# Patient Record
Sex: Male | Born: 1950 | Race: Black or African American | Hispanic: No | Marital: Married | Smoking: Former smoker
Health system: Southern US, Community
[De-identification: ages and names within clinical notes are randomized; demographics above are authoritative.]

## PROBLEM LIST (undated history)

## (undated) DIAGNOSIS — K220 Achalasia of cardia: Secondary | ICD-10-CM

## (undated) DIAGNOSIS — I10 Essential (primary) hypertension: Secondary | ICD-10-CM

## (undated) DIAGNOSIS — M109 Gout, unspecified: Secondary | ICD-10-CM

## (undated) DIAGNOSIS — Z974 Presence of external hearing-aid: Secondary | ICD-10-CM

## (undated) DIAGNOSIS — E119 Type 2 diabetes mellitus without complications: Secondary | ICD-10-CM

## (undated) DIAGNOSIS — E7800 Pure hypercholesterolemia, unspecified: Secondary | ICD-10-CM

## (undated) HISTORY — PX: HERNIA REPAIR: SHX51

## (undated) HISTORY — PX: CHOLECYSTECTOMY: SHX55

---

## 2009-01-14 DIAGNOSIS — R17 Unspecified jaundice: Secondary | ICD-10-CM | POA: Insufficient documentation

## 2009-03-06 DIAGNOSIS — M109 Gout, unspecified: Secondary | ICD-10-CM | POA: Insufficient documentation

## 2009-03-06 DIAGNOSIS — E785 Hyperlipidemia, unspecified: Secondary | ICD-10-CM | POA: Insufficient documentation

## 2010-10-07 ENCOUNTER — Emergency Department: Payer: Self-pay | Admitting: *Deleted

## 2010-11-12 ENCOUNTER — Ambulatory Visit: Payer: Self-pay | Admitting: Surgery

## 2010-11-18 ENCOUNTER — Ambulatory Visit: Payer: Self-pay | Admitting: Surgery

## 2010-11-20 LAB — PATHOLOGY REPORT

## 2011-07-15 ENCOUNTER — Emergency Department: Payer: Self-pay | Admitting: Emergency Medicine

## 2011-07-15 LAB — BASIC METABOLIC PANEL
Anion Gap: 5 — ABNORMAL LOW (ref 7–16)
BUN: 17 mg/dL (ref 7–18)
Calcium, Total: 9 mg/dL (ref 8.5–10.1)
Chloride: 106 mmol/L (ref 98–107)
Creatinine: 1.5 mg/dL — ABNORMAL HIGH (ref 0.60–1.30)
EGFR (African American): 58 — ABNORMAL LOW
EGFR (Non-African Amer.): 50 — ABNORMAL LOW
Glucose: 122 mg/dL — ABNORMAL HIGH (ref 65–99)
Potassium: 3.8 mmol/L (ref 3.5–5.1)

## 2011-07-15 LAB — CBC
HGB: 13.5 g/dL (ref 13.0–18.0)
MCH: 30.3 pg (ref 26.0–34.0)
MCV: 95 fL (ref 80–100)
Platelet: 168 10*3/uL (ref 150–440)
RBC: 4.46 10*6/uL (ref 4.40–5.90)
WBC: 14.4 10*3/uL — ABNORMAL HIGH (ref 3.8–10.6)

## 2011-07-16 LAB — LIPASE, BLOOD: Lipase: 1216 U/L — ABNORMAL HIGH (ref 73–393)

## 2012-04-28 ENCOUNTER — Emergency Department: Payer: Self-pay | Admitting: Emergency Medicine

## 2012-04-28 LAB — CBC
HCT: 40.7 % (ref 40.0–52.0)
HGB: 13.5 g/dL (ref 13.0–18.0)
MCHC: 33.3 g/dL (ref 32.0–36.0)
MCV: 93 fL (ref 80–100)
Platelet: 182 10*3/uL (ref 150–440)
RDW: 14.9 % — ABNORMAL HIGH (ref 11.5–14.5)

## 2012-04-28 LAB — COMPREHENSIVE METABOLIC PANEL
Albumin: 3.6 g/dL (ref 3.4–5.0)
Alkaline Phosphatase: 73 U/L (ref 50–136)
Anion Gap: 6 — ABNORMAL LOW (ref 7–16)
Calcium, Total: 9.3 mg/dL (ref 8.5–10.1)
Chloride: 108 mmol/L — ABNORMAL HIGH (ref 98–107)
Creatinine: 1.27 mg/dL (ref 0.60–1.30)
EGFR (Non-African Amer.): >60
Osmolality: 287 (ref 275–301)
Potassium: 4.1 mmol/L (ref 3.5–5.1)

## 2012-10-13 ENCOUNTER — Emergency Department: Payer: Self-pay | Admitting: Emergency Medicine

## 2012-10-13 LAB — COMPREHENSIVE METABOLIC PANEL
Alkaline Phosphatase: 95 U/L (ref 50–136)
BUN: 16 mg/dL (ref 7–18)
Bilirubin,Total: 0.5 mg/dL (ref 0.2–1.0)
Creatinine: 1.25 mg/dL (ref 0.60–1.30)
Osmolality: 281 (ref 275–301)
Potassium: 4.2 mmol/L (ref 3.5–5.1)
SGOT(AST): 17 U/L (ref 15–37)
Sodium: 139 mmol/L (ref 136–145)
Total Protein: 8.7 g/dL — ABNORMAL HIGH (ref 6.4–8.2)

## 2012-10-13 LAB — URINALYSIS, COMPLETE
Bilirubin,UR: NEGATIVE
Glucose,UR: NEGATIVE mg/dL (ref 0–75)
Hyaline Cast: 5
Protein: NEGATIVE
RBC,UR: <1 /HPF (ref 0–5)
Specific Gravity: 1.015 (ref 1.003–1.030)
Squamous Epithelial: <1
WBC UR: 5 /HPF (ref 0–5)

## 2012-10-13 LAB — CBC
HGB: 15.6 g/dL (ref 13.0–18.0)
MCH: 31.2 pg (ref 26.0–34.0)
MCHC: 33.7 g/dL (ref 32.0–36.0)
MCV: 93 fL (ref 80–100)
RBC: 5 10*6/uL (ref 4.40–5.90)
RDW: 13.6 % (ref 11.5–14.5)
WBC: 15.7 10*3/uL — ABNORMAL HIGH (ref 3.8–10.6)

## 2012-10-13 LAB — LIPASE, BLOOD: Lipase: 224 U/L (ref 73–393)

## 2012-10-14 ENCOUNTER — Emergency Department: Payer: Self-pay | Admitting: Emergency Medicine

## 2012-10-17 ENCOUNTER — Inpatient Hospital Stay: Payer: Self-pay | Admitting: Internal Medicine

## 2012-10-17 LAB — URINALYSIS, COMPLETE
Bacteria: NONE SEEN
Bilirubin,UR: NEGATIVE
Glucose,UR: 50 mg/dL (ref 0–75)
RBC,UR: <1 /HPF (ref 0–5)
WBC UR: 1 /HPF (ref 0–5)

## 2012-10-17 LAB — COMPREHENSIVE METABOLIC PANEL
Albumin: 3.7 g/dL (ref 3.4–5.0)
Anion Gap: 5 — ABNORMAL LOW (ref 7–16)
Creatinine: 1.5 mg/dL — ABNORMAL HIGH (ref 0.60–1.30)
Potassium: 3.8 mmol/L (ref 3.5–5.1)
SGOT(AST): 28 U/L (ref 15–37)
Sodium: 139 mmol/L (ref 136–145)

## 2012-10-17 LAB — CBC
HCT: 43.2 % (ref 40.0–52.0)
HGB: 14.7 g/dL (ref 13.0–18.0)
MCH: 31.1 pg (ref 26.0–34.0)
MCV: 91 fL (ref 80–100)
Platelet: 197 10*3/uL (ref 150–440)
RBC: 4.72 10*6/uL (ref 4.40–5.90)
RDW: 13.5 % (ref 11.5–14.5)
WBC: 15.1 10*3/uL — ABNORMAL HIGH (ref 3.8–10.6)

## 2012-10-17 LAB — OCCULT BLOOD X 1 CARD TO LAB, STOOL: Occult Blood, Feces: POSITIVE

## 2012-10-17 LAB — CLOSTRIDIUM DIFFICILE BY PCR

## 2012-10-17 LAB — LIPASE, BLOOD: Lipase: 317 U/L (ref 73–393)

## 2012-10-18 LAB — BASIC METABOLIC PANEL
BUN: 13 mg/dL (ref 7–18)
Calcium, Total: 7.9 mg/dL — ABNORMAL LOW (ref 8.5–10.1)
Chloride: 114 mmol/L — ABNORMAL HIGH (ref 98–107)
Co2: 22 mmol/L (ref 21–32)
Creatinine: 1.37 mg/dL — ABNORMAL HIGH (ref 0.60–1.30)
EGFR (African American): >60
EGFR (Non-African Amer.): 55 — ABNORMAL LOW
Glucose: 105 mg/dL — ABNORMAL HIGH (ref 65–99)
Potassium: 3.7 mmol/L (ref 3.5–5.1)
Sodium: 145 mmol/L (ref 136–145)

## 2012-10-18 LAB — CBC WITH DIFFERENTIAL/PLATELET
Basophil %: 0.4 %
Eosinophil %: 8.1 %
HCT: 36 % — ABNORMAL LOW (ref 40.0–52.0)
HGB: 12.2 g/dL — ABNORMAL LOW (ref 13.0–18.0)
Lymphocyte #: 2.3 10*3/uL (ref 1.0–3.6)
Lymphocyte %: 14.9 %
MCH: 31.1 pg (ref 26.0–34.0)
MCHC: 34 g/dL (ref 32.0–36.0)
MCV: 92 fL (ref 80–100)
Monocyte #: 1.3 x10 3/mm — ABNORMAL HIGH (ref 0.2–1.0)
Monocyte %: 8.5 %
Neutrophil %: 68.1 %
Platelet: 160 10*3/uL (ref 150–440)
RBC: 3.93 10*6/uL — ABNORMAL LOW (ref 4.40–5.90)

## 2012-10-18 LAB — HEMOGLOBIN A1C: Hemoglobin A1C: 7.3 % — ABNORMAL HIGH (ref 4.2–6.3)

## 2012-10-18 LAB — TSH: Thyroid Stimulating Horm: 1.61 u[IU]/mL

## 2012-10-18 LAB — WBCS, STOOL

## 2012-10-19 LAB — BASIC METABOLIC PANEL
Anion Gap: 7 (ref 7–16)
BUN: 9 mg/dL (ref 7–18)
Calcium, Total: 7.9 mg/dL — ABNORMAL LOW (ref 8.5–10.1)
Chloride: 117 mmol/L — ABNORMAL HIGH (ref 98–107)
Creatinine: 1.28 mg/dL (ref 0.60–1.30)
Glucose: 87 mg/dL (ref 65–99)
Osmolality: 287 (ref 275–301)
Potassium: 3.3 mmol/L — ABNORMAL LOW (ref 3.5–5.1)
Sodium: 145 mmol/L (ref 136–145)

## 2012-10-19 LAB — CBC WITH DIFFERENTIAL/PLATELET
Basophil #: 0 10*3/uL (ref 0.0–0.1)
Eosinophil #: 1 10*3/uL — ABNORMAL HIGH (ref 0.0–0.7)
Eosinophil %: 9.2 %
Lymphocyte #: 2.4 10*3/uL (ref 1.0–3.6)
MCH: 31.7 pg (ref 26.0–34.0)
MCHC: 34.3 g/dL (ref 32.0–36.0)
Monocyte %: 8.5 %
Neutrophil %: 59.9 %
RBC: 3.63 10*6/uL — ABNORMAL LOW (ref 4.40–5.90)
RDW: 13.5 % (ref 11.5–14.5)
WBC: 10.7 10*3/uL — ABNORMAL HIGH (ref 3.8–10.6)

## 2012-10-19 LAB — PROTIME-INR: Prothrombin Time: 15.5 secs — ABNORMAL HIGH (ref 11.5–14.7)

## 2012-10-19 LAB — MAGNESIUM: Magnesium: 1.2 mg/dL — ABNORMAL LOW

## 2012-10-20 LAB — CBC WITH DIFFERENTIAL/PLATELET
Basophil #: 0 10*3/uL (ref 0.0–0.1)
Basophil %: 0.4 %
HCT: 33.2 % — ABNORMAL LOW (ref 40.0–52.0)
HGB: 11.6 g/dL — ABNORMAL LOW (ref 13.0–18.0)
Lymphocyte #: 2.2 10*3/uL (ref 1.0–3.6)
MCH: 31.9 pg (ref 26.0–34.0)
MCHC: 34.9 g/dL (ref 32.0–36.0)
Neutrophil #: 5.8 10*3/uL (ref 1.4–6.5)
Neutrophil %: 55.9 %
Platelet: 149 10*3/uL — ABNORMAL LOW (ref 150–440)
WBC: 10.3 10*3/uL (ref 3.8–10.6)

## 2012-10-20 LAB — STOOL CULTURE

## 2012-10-26 ENCOUNTER — Emergency Department: Payer: Self-pay | Admitting: Emergency Medicine

## 2012-10-26 LAB — CBC
HGB: 13.5 g/dL (ref 13.0–18.0)
MCV: 92 fL (ref 80–100)
Platelet: 248 10*3/uL (ref 150–440)
RBC: 4.38 10*6/uL — ABNORMAL LOW (ref 4.40–5.90)

## 2012-10-26 LAB — COMPREHENSIVE METABOLIC PANEL
Albumin: 3.5 g/dL (ref 3.4–5.0)
Anion Gap: 5 — ABNORMAL LOW (ref 7–16)
BUN: 12 mg/dL (ref 7–18)
Bilirubin,Total: 0.6 mg/dL (ref 0.2–1.0)
Calcium, Total: 9.3 mg/dL (ref 8.5–10.1)
Chloride: 106 mmol/L (ref 98–107)
Co2: 27 mmol/L (ref 21–32)
EGFR (Non-African Amer.): 57 — ABNORMAL LOW
Glucose: 96 mg/dL (ref 65–99)
Osmolality: 275 (ref 275–301)
Potassium: 4.1 mmol/L (ref 3.5–5.1)
SGOT(AST): 24 U/L (ref 15–37)
SGPT (ALT): 30 U/L (ref 12–78)

## 2012-10-26 LAB — URINALYSIS, COMPLETE
Bacteria: NONE SEEN
Blood: NEGATIVE
Glucose,UR: NEGATIVE mg/dL (ref 0–75)
Ketone: NEGATIVE
Protein: NEGATIVE
Specific Gravity: 1.013 (ref 1.003–1.030)

## 2012-10-28 ENCOUNTER — Other Ambulatory Visit: Payer: Self-pay | Admitting: Gastroenterology

## 2013-01-20 ENCOUNTER — Ambulatory Visit: Payer: Self-pay | Admitting: Cardiothoracic Surgery

## 2013-02-07 ENCOUNTER — Ambulatory Visit: Payer: Self-pay | Admitting: Cardiothoracic Surgery

## 2013-07-26 IMAGING — CT CT ABD-PELV W/O CM
1 of 2 series · 15 of 32 positions shown, 19 images · non-contrast
Comparison: none

REASON FOR EXAM: (1) ABD PAIN; (2) ABD PAIN
COMMENTS:

[Series 2: 3mm soft tissue · axial · 0.72mm/px · z∈[-1568,-1166]mm · 15 of 148 slices shown, 19 images]
[im 7/148  soft-tissue]
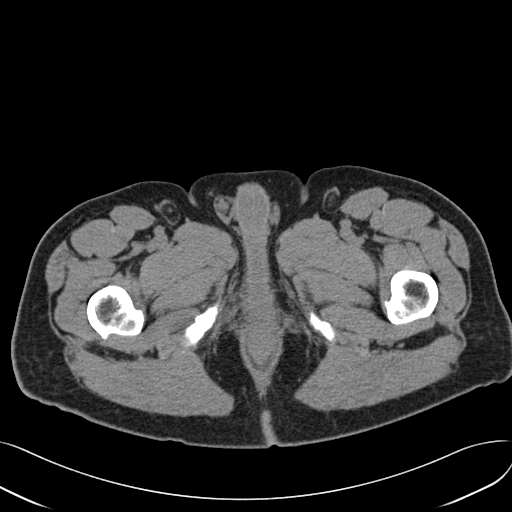
[im 7/148  bone]
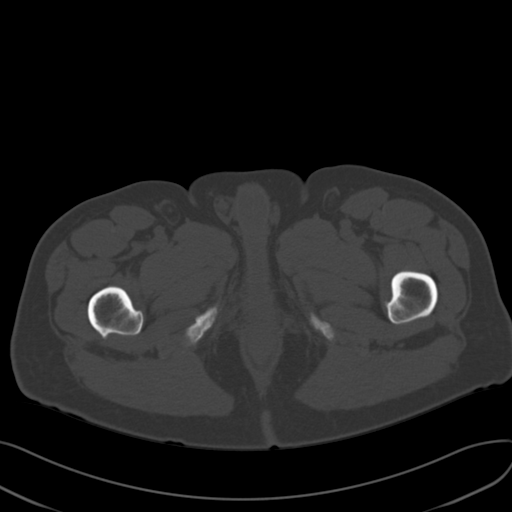
[im 19/148  soft-tissue]
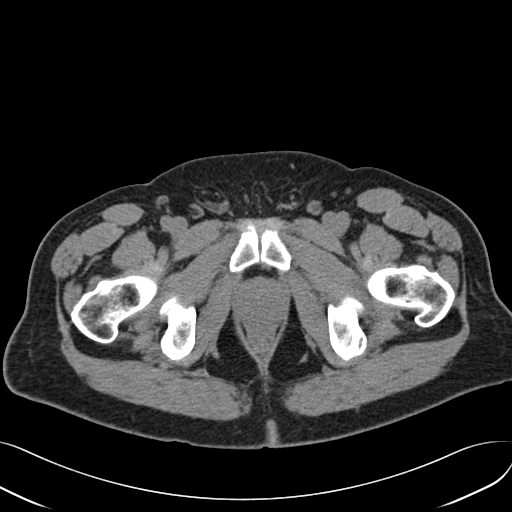
[im 31/148  soft-tissue]
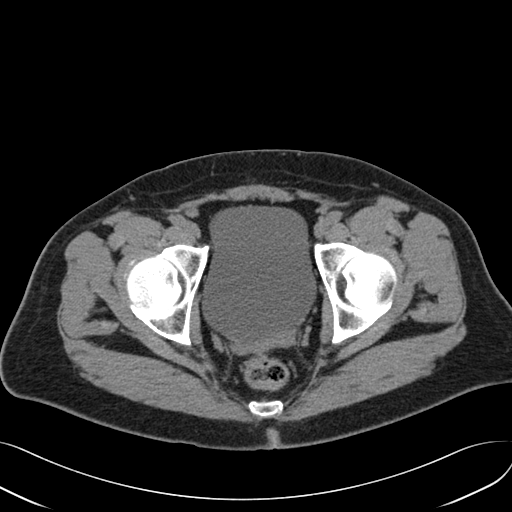
[im 43/148  soft-tissue]
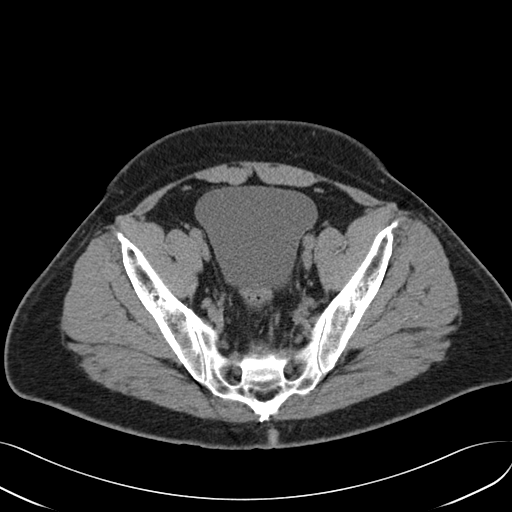
[im 50/148  soft-tissue]
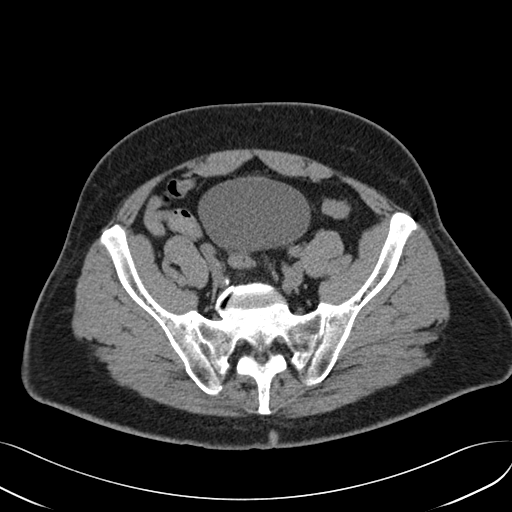
[im 62/148  soft-tissue]
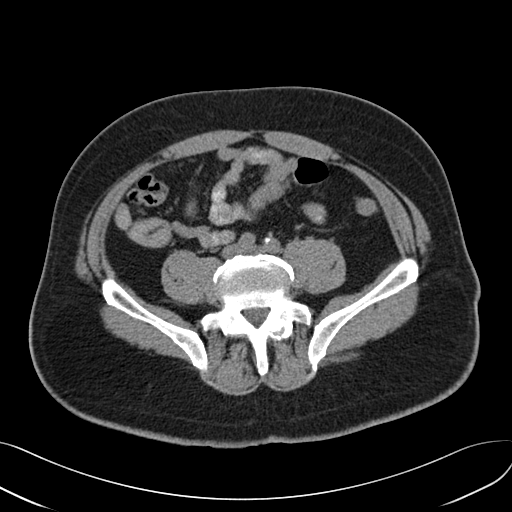
[im 74/148  soft-tissue]
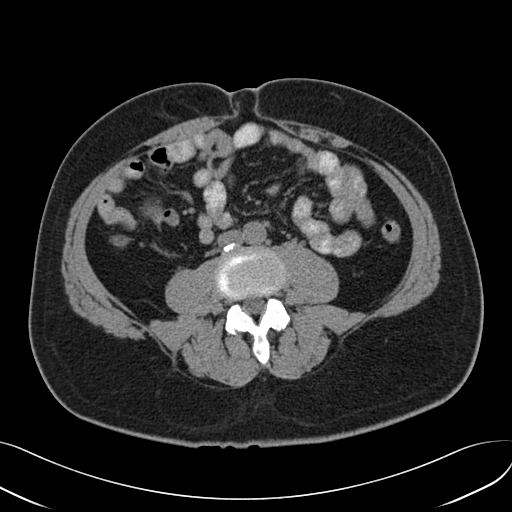
[im 86/148  soft-tissue]
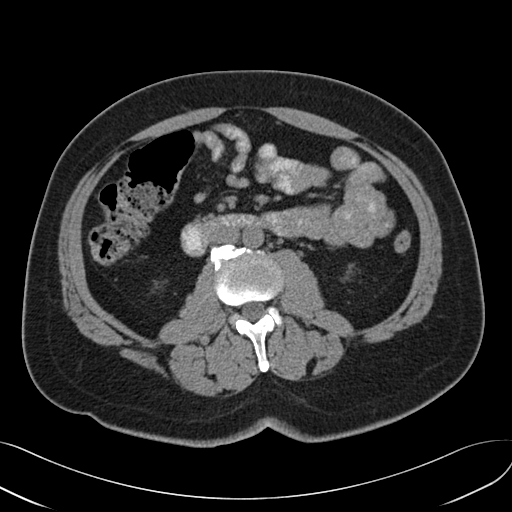
[im 99/148  soft-tissue]
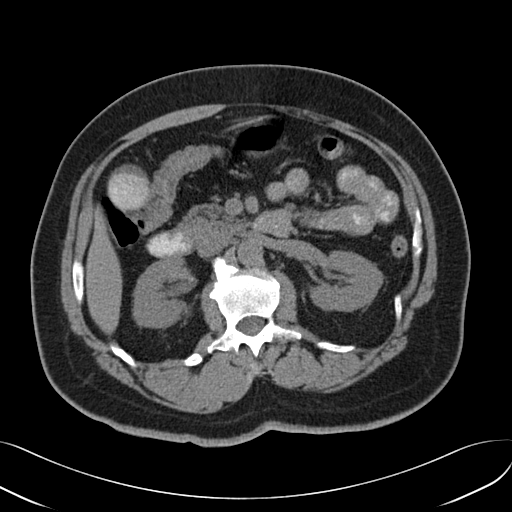
[im 99/148  bone]
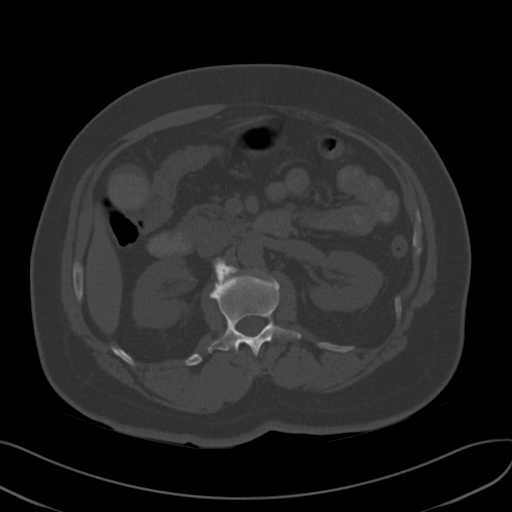
[im 105/148  soft-tissue]
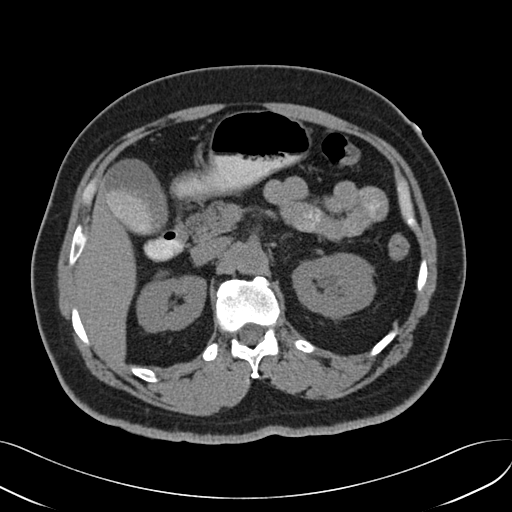
[im 117/148  soft-tissue]
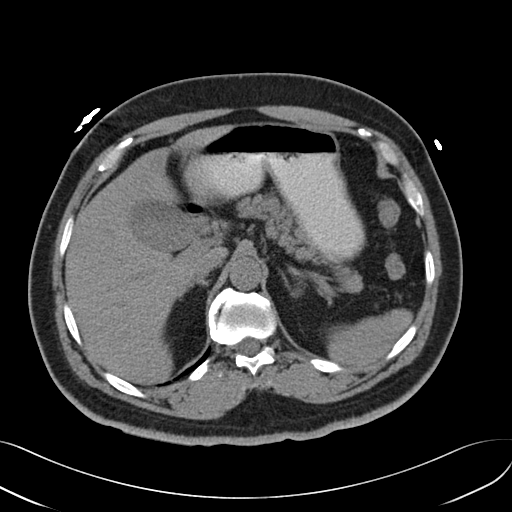
[im 123/148  lung]
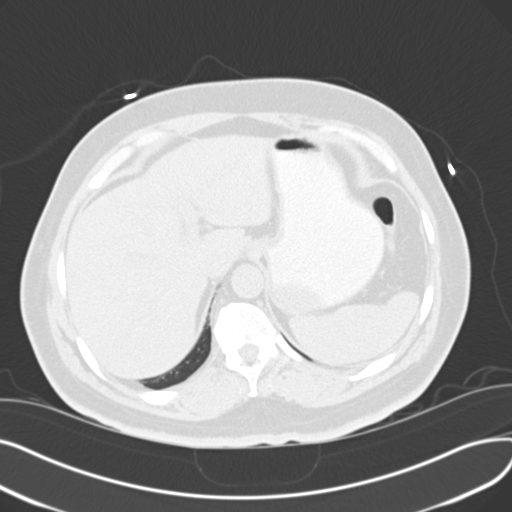
[im 129/148  soft-tissue]
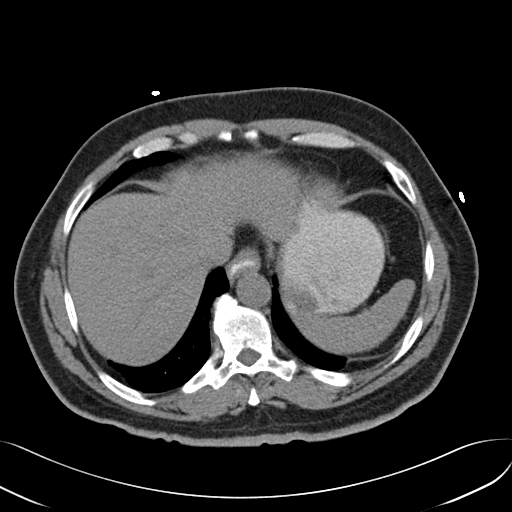
[im 129/148  lung]
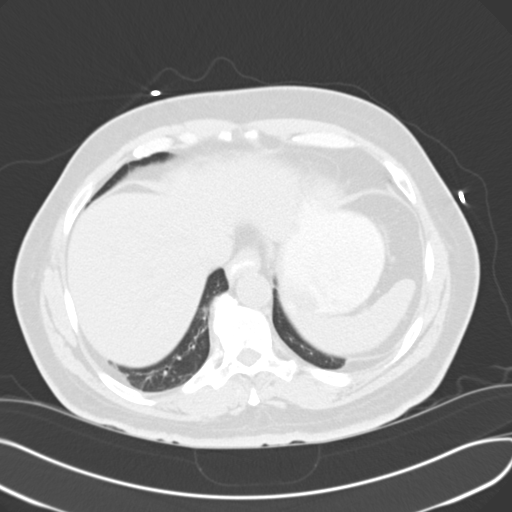
[im 135/148  lung]
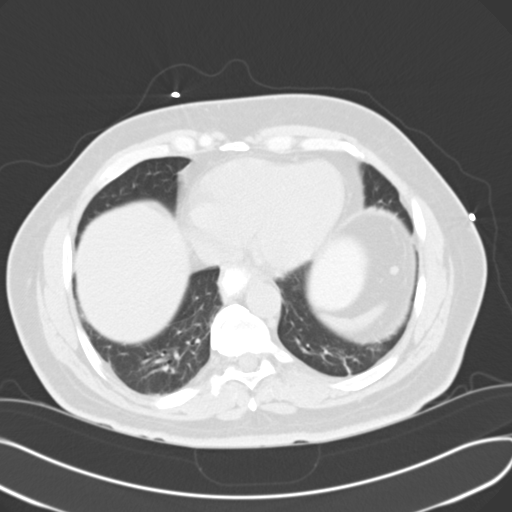
[im 141/148  soft-tissue]
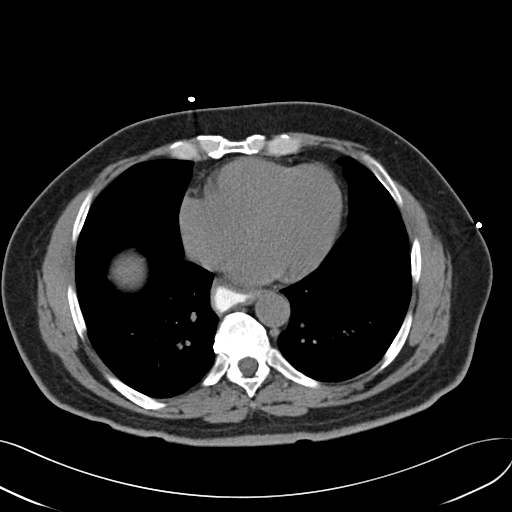
[im 141/148  lung]
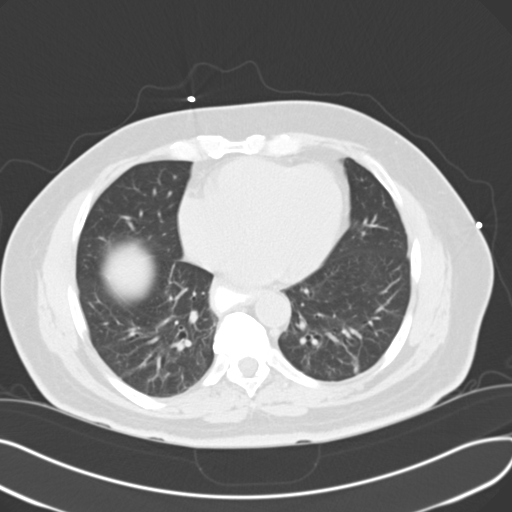

[15 of 32 positions shown; findings below may reference images not displayed]

PROCEDURE:     CT  - CT ABDOMEN AND PELVIS W[DATE]  [DATE]

RESULT:     Emergent noncontrast CT of the abdomen and pelvis is
reconstructed in the axial plane at 3.0 mm slice thickness. The patient has
no previous exam for comparison.

There is dependent atelectasis in both lower lobes. The noncontrast images
of the liver, spleen, pancreas and adrenal glands are unremarkable. There is
density within the gallbladder suggestive of stones or sludge. There is no
evidence of acute cholecystitis by CT. No nephrolithiasis or hydronephrosis
is evident. No abnormal gastric or bowel distention is seen. A
normal-appearing appendix appears present. Urine is present within the
bladder. There is no hydroureter or hydronephrosis. The prostate appears
within normal limits. No adenopathy or acute inflammation is seen. There is
a small fat-filled umbilical hernia. The aorta is normal in caliber.
Degenerative osteophytic spurring is present within the spine.
IMPRESSION: 1. Findings of cholelithiasis without definite CT evidence of acute
cholecystitis.
2. Incidental note, not mentioned above, is made of some oral contrast in
the distal esophagus. Correlate for possible gastroesophageal reflux.
3. Small fat-filled umbilical hernia.

## 2014-06-05 ENCOUNTER — Emergency Department: Payer: Self-pay | Admitting: Emergency Medicine

## 2014-06-05 LAB — CBC WITH DIFFERENTIAL/PLATELET
BASOS ABS: 0.1 10*3/uL (ref 0.0–0.1)
Basophil %: 0.8 %
Eosinophil #: 0.2 10*3/uL (ref 0.0–0.7)
Eosinophil %: 3.1 %
HCT: 39.8 % — ABNORMAL LOW (ref 40.0–52.0)
HGB: 13 g/dL (ref 13.0–18.0)
LYMPHS ABS: 1.5 10*3/uL (ref 1.0–3.6)
Lymphocyte %: 21 %
MCH: 30 pg (ref 26.0–34.0)
MCHC: 32.7 g/dL (ref 32.0–36.0)
MCV: 92 fL (ref 80–100)
MONO ABS: 0.6 x10 3/mm (ref 0.2–1.0)
Monocyte %: 9.1 %
Neutrophil #: 4.6 10*3/uL (ref 1.4–6.5)
Neutrophil %: 66 %
Platelet: 197 10*3/uL (ref 150–440)
RBC: 4.33 10*6/uL — ABNORMAL LOW (ref 4.40–5.90)
RDW: 13.7 % (ref 11.5–14.5)
WBC: 7 10*3/uL (ref 3.8–10.6)

## 2014-06-05 LAB — BASIC METABOLIC PANEL
Anion Gap: 8 (ref 7–16)
BUN: 17 mg/dL
CREATININE: 1.17 mg/dL
Calcium, Total: 9.4 mg/dL
Chloride: 106 mmol/L
Co2: 27 mmol/L
EGFR (African American): >60
Glucose: 147 mg/dL — ABNORMAL HIGH
Potassium: 4 mmol/L
SODIUM: 141 mmol/L

## 2014-06-05 LAB — TROPONIN I
Troponin-I: <0.03 ng/mL
Troponin-I: <0.03 ng/mL

## 2014-06-30 NOTE — Consult Note (Signed)
Chief Complaint:  Subjective/Chief Complaint seen for dysphagia and abnormal ct sigmoid colon. tolerated prep for colonoscopy well.  mild nausea, no bleeding with the prep.  mild lower abdominal discomfort.   VITAL SIGNS/ANCILLARY NOTES: **Vital Signs.:   13-Aug-14 04:09  Vital Signs Type Routine  Temperature Temperature (F) 98  Celsius 36.6  Pulse Pulse 57  Respirations Respirations 20  Systolic BP Systolic BP 103  Diastolic BP (mmHg) Diastolic BP (mmHg) 62  Mean BP 75  Pulse Ox % Pulse Ox % 97  Pulse Ox Activity Level  At rest  Oxygen Delivery Room Air/ 21 %   Brief Assessment:  Cardiac Regular   Respiratory clear BS   Gastrointestinal details normal Soft  Bowel sounds normal  No rebound tenderness  mild lower discomfort to palpation   Lab Results: Microbiology:  10-Aug-14 22:02   Ova and Parasites (O/P) ========== TEST NAME ==========  ========= RESULTS =========  = REFERENCE RANGE =  OVA & PARASITES (O&P)  Ova + Parasite Exam Ova + Parasite Exam             [   Final report         ]                   These results were obtained using wet preparation(s) and trichrome stained smear. This test does not include testing for Cryptosporidium parvum, Cyclospora, or Microsporidia. Result 1                        [   Final Report         ]                   No ova, cysts, or parasites seen.                                                          . One negative specimen does not rule out the possibility of a parasitic infection.               [facility]            No: [id number]           [address], [place], [place] [address]           Mila Homer, MD         [id number]   Result(s) reported on 20 Oct 2012 at 10:49AM.  Routine Chem:  13-Aug-14 04:03   Magnesium, Serum 2.1 (1.8-2.4 THERAPEUTIC RANGE: 4-7 mg/dL TOXIC: > 10 mg/dL  -----------------------)  Potassium, Serum 3.5 (Result(s) reported on 20 Oct 2012 at 04:54AM.)  Routine Sero:   10-Aug-14 22:02   Occult Blood, Feces POSITIVE (Result(s) reported on 17 Oct 2012 at 11:13PM.)  Routine Coag:  12-Aug-14 04:03   INR 1.2 (INR reference interval applies to patients on anticoagulant therapy. A single INR therapeutic range for coumarins is not optimal for all indications; however, the suggested range for most indications is 2.0 - 3.0. Exceptions to the INR Reference Range may include: Prosthetic heart valves, acute myocardial infarction, prevention of myocardial infarction, and combinations of aspirin and anticoagulant. The need for a higher or lower target INR must be assessed individually. Reference: The Pharmacology and Management of the Vitamin K  antagonists: the seventh ACCP Conference on Antithrombotic and Thrombolytic Therapy. Chest.2004 Sept:126 (3suppl): [id number]. A HCT value >55% may artifactually increase the PT.  In one study,  the increase was an average of 25%. Reference:  "Effect on Routine and Special Coagulation Testing Values of Citrate Anticoagulant Adjustment in Patients with High HCT Values." American Journal of Clinical Pathology 2006;126:400-405.)  Routine Hem:  13-Aug-14 04:03   WBC (CBC) 10.3  RBC (CBC)  3.63  Hemoglobin (CBC)  11.6  Hematocrit (CBC)  33.2  Platelet Count (CBC)  149  MCV 91  MCH 31.9  MCHC 34.9  RDW 13.4  Neutrophil % 55.9  Lymphocyte % 21.7  Monocyte % 7.2  Eosinophil % 14.8  Basophil % 0.4  Neutrophil # 5.8  Lymphocyte # 2.2  Monocyte # 0.7  Eosinophil #  1.5  Basophil # 0.0 (Result(s) reported on 20 Oct 2012 at 04:47AM.)   Assessment/Plan:  Assessment/Plan:  Assessment 1) dysphagia-achalasia per egd and history.  will arrange for outpatient manometry and ba sw.  Possible referral as outpatient for evaluation for heller myotomy.  2) abnormal ct with possible apple core lesion in the distal sigmoid.  Colonoscopy today.   Plan 1) I have discussed wht risks benefits and complications of egd to include no t  limited to bleeding infection perforation and sedation and he wishes to proceed.   Electronic Signatures: Barnetta ChapelSkulskie, Martin (MD)  (Signed 13-Aug-14 12:55)  Authored: Chief Complaint, VITAL SIGNS/ANCILLARY NOTES, Brief Assessment, Lab Results, Assessment/Plan   Last Updated: 13-Aug-14 12:55 by Barnetta ChapelSkulskie, Martin (MD)

## 2014-06-30 NOTE — H&P (Signed)
PATIENT NAME:  Andrew Cantrell, Andrew G MR#:  [id number] DATE OF BIRTH:  [birth year]  DATE OF ADMISSION:  10/18/2012  PRIMARY CARE PHYSICIAN: Dr. Darreld McleanLinda Miles.   REFERRING PHYSICIAN: Dr. Dorothea GlassmanPaul Malinda.   CHIEF COMPLAINT: Nausea, vomiting and diarrhea.   HISTORY OF PRESENT ILLNESS: Andrew Cantrell is a 64 year old African American male with past medical history of diabetes mellitus and hypertension, presented to the Emergency Department with complaints of nausea, vomiting and diarrhea of 1 week's duration. Has been having multiple episodes of vomiting of whatever the patient eats. Also has been having diarrhea, multiple episodes of greenish-brownish stools with some fecal material, having 6 to 7 times a day. This is associated with abdominal pain in the periumbilical area. Experiences some subjective fevers. Denies having any sick contacts. Denies eating any leftover food or started on recent medication. He denies having any recent travel. Workup in the Emergency Department with CT abdomen and pelvis showed there was concern about narrowing of the rectosigmoid area. Otherwise, no other obvious abnormalities were noted. Lab workup revealed mild elevation of the BUN and creatinine, as well as elevated white blood cell count. The patient received IV fluids in the Emergency Department as well as antinausea medication. The patient came to the Emergency Department in the last 2 to 3 days. Was treated conservatively without any improvement.   PAST MEDICAL HISTORY:  1. Hypertension.  2. Diabetes mellitus, on oral medication.  3. Hyperlipidemia.  4. Gout.  5. History of gallstones, status post cholecystectomy.  6. Previous history of pancreatitis.   ALLERGIES: MORPHINE AND TYLENOL.   HOME MEDICATIONS:  1. Zofran 4 mg every 4 hours as needed.  2. Oxycodone 5 mg every 6 hours as needed.  3. Metformin 500 mg 2 times a day.  4. Lovastatin 40 mg at bedtime.  5. Lisinopril 5 mg once a day.  6. Gabapentin 300 mg 3  times a day.  7. Celebrex 200 mg as needed.   SOCIAL HISTORY: Denies smoking, drinking alcohol or using illicit drugs. Married, lives with his wife. Works as a Administratorlandscaper.   FAMILY HISTORY: History of diabetes mellitus and hypertension.   REVIEW OF SYSTEMS:  CONSTITUTIONAL: Generalized weakness.  EYES: No change in vision.  EARS AND THROAT: No change in hearing. No sore throat or tinnitus.  RESPIRATORY: No cough, shortness of breath.  CARDIOVASCULAR: No chest pain, palpitations.  GASTROINTESTINAL: Nausea, vomiting and diarrhea.  GENITOURINARY: No dysuria or hematuria.  ENDOCRINE: No polyuria or polydipsia.  HEMATOLOGIC: No easy bruising or bleeding.  NEUROLOGIC: No weakness or numbness in any part of the body.   PHYSICAL EXAMINATION:  GENERAL: This is a well-built, well-nourished, age-appropriate male lying down in the bed, not in distress.  VITAL SIGNS: Temperature 99.4, pulse 93, blood pressure 121/64, respiratory rate of 16, oxygen saturation is 98% on room air.  HEENT: Head normocephalic, atraumatic. There is no sclerae icterus. Conjunctivae normal. Pupils equal and react to light. Extraocular movements are intact. Mucous membranes: Mild dryness. No pharyngeal erythema.  NECK: Supple. No lymphadenopathy. No JVD. No carotid bruit. No thyromegaly.  CHEST: Has no focal tenderness.  LUNGS: Bilaterally clear to auscultation.  HEART: S1, S2, regular, tachycardia. No pedal edema. Pulses 2+ in the dorsalis pedis and posterior tibialis.  ABDOMEN: Mild distention of the abdomen. Bowel sounds present. Soft. Mild tenderness in the periumbilical area. No guarding or rebound tenderness. No hepatosplenomegaly.  NEUROLOGIC: The patient is alert, oriented to place, person and time. Cranial nerves II through XII intact. No motor  and sensory deficits.  SKIN: No rash or lesions.  MUSCULOSKELETAL: Good range of motion in all of the extremities.   LABS: UA negative for nitrites and leukocyte esterase.  CT abdomen and pelvis as mentioned above showed:  1. Luminal narrowing at the rectosigmoid area with concern about possible carcinoma; however, there is a possibility that this is spasm. Fibrotic changes in the area could also give a similar appearance.  2. Hiatal hernia.   3. Right inguinal hernia.   4. Hepatic steatosis.   ASSESSMENT AND PLAN: Andrew Cantrell is a 64 year old male who comes to the Emergency Department with nausea, vomiting, abdominal pain, diarrhea.  1. Gastroenteritis: Keep the patient clear liquids. Continue with antinausea medications, intravenous fluids Clostridium difficile toxin is negative. Could give Imodium if needed.  2. Leukocytosis: Could be from the dehydration.  3. Acute renal insufficiency secondary to dehydration: Continue with intravenous fluids.  4. Diabetes mellitus: Will hold the metformin for now. Keep the patient on sliding scale insulin.  5. Hypertension: Currently well controlled. Continue with home medications.  6. Keep the patient on deep vein thrombosis prophylaxis with Lovenox.   TIME SPENT: 45 minutes.   ____________________________ Susa Griffins, MD pv:gb D: 10/18/2012 00:14:55 ET T: 10/18/2012 02:49:01 ET JOB#: [id number]  cc: Susa Griffins, MD, <Dictator> Leanna Sato, MD Susa Griffins MD ELECTRONICALLY SIGNED 11/10/2012 21:58

## 2014-06-30 NOTE — Consult Note (Signed)
Brief Consult Note: Diagnosis: NVD.   Patient was seen by consultant.   Consult note dictated.   Comments: Appreciate consult for very pleasant 64 y/o [place] man for evaluation of NVD. States symptoms started a week ago monday: developed some lower abdominal cramping followed by diarrhea, then indigestion,  nausea and vomiting. Unable to identify a trigger- no sick contacts/antibiotics. Feels weak, bloated, and that his stomach bubbles sometimes. States that he has had 7 episodes of vomting- either light colored, or with food stuffs. No hematemesis. Up to 8 stools/d, described as green and watery.  Associated with nocturnal wakenings and urgency. Has not wanted to eat or leave the house due to this. Beano seemed to help some, but Tums exacerbated sx. States he is feeling some better today. C-diff test negative, stool culter negative so far, no wbc/rbc in stool, but heme positive. 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 Does report a history of intermittent dysphagia that has worsened with his current condition- worse with pills and solids, soft foods ok.  Do note applecore type lesion to the rectosigmoid colon  and patulous area in the distal esophagus,  Labs stable. Currently on IV PPI and clear liquid diet. Impression and plan: Abdominal pain, GERD, dysphagia, diarrhea, abnormal CT findings, heme positive stool. Patient feeling somewhat better. Agree with PPI. Do recommend luminal evaluation- will start with EGD and then proceed to colonoscopy. Have explained this to patient and wife and they are agreeable..  Electronic Signatures: Vevelyn PatLondon, Makylee Sanborn H (NP)  (Signed 11-Aug-14 18:07)  Authored: Brief Consult Note   Last Updated: 11-Aug-14 18:07 by Keturah BarreLondon, Vamsi Apfel H (NP)

## 2014-06-30 NOTE — Consult Note (Signed)
Chief Complaint:  Subjective/Chief Complaint mild epigastric pain, mild nausea no emesis, one watery bm this am.  not bloody.   VITAL SIGNS/ANCILLARY NOTES: **Vital Signs.:   12-Aug-14 04:07  Vital Signs Type Routine  Temperature Temperature (F) 98.2  Celsius 36.7  Pulse Pulse 59  Respirations Respirations 20  Systolic BP Systolic BP 161  Diastolic BP (mmHg) Diastolic BP (mmHg) 65  Mean BP 79  Pulse Ox % Pulse Ox % 94  Pulse Ox Activity Level  At rest  Oxygen Delivery Room Air/ 21 %   Brief Assessment:  Cardiac Regular   Respiratory clear BS   Gastrointestinal details normal Soft  Nondistended  Bowel sounds normal  No rebound tenderness  tender left right epigastrum   Lab Results: Routine Chem:  12-Aug-14 04:03   Magnesium, Serum  1.2 (1.8-2.4 THERAPEUTIC RANGE: 4-7 mg/dL TOXIC: > 10 mg/dL  -----------------------)  Glucose, Serum 87  BUN 9  Creatinine (comp) 1.28  Sodium, Serum 145  Potassium, Serum  3.3  Chloride, Serum  117  CO2, Serum 21  Calcium (Total), Serum  7.9  Anion Gap 7  Osmolality (calc) 287  eGFR (African American) >60  eGFR (Non-African American) >60 (eGFR values <76m/min/1.73 m2 may be an indication of chronic kidney disease (CKD). Calculated eGFR is useful in patients with stable renal function. The eGFR calculation will not be reliable in acutely ill patients when serum creatinine is changing rapidly. It is not useful in  patients on dialysis. The eGFR calculation may not be applicable to patients at the low and high extremes of body sizes, pregnant women, and vegetarians.)  Routine Sero:  10-Aug-14 22:02   Occult Blood, Feces POSITIVE (Result(s) reported on 17 Oct 2012 at 11:13PM.)  Routine Coag:  12-Aug-14 04:03   Prothrombin  15.5  INR 1.2 (INR reference interval applies to patients on anticoagulant therapy. A single INR therapeutic range for coumarins is not optimal for all indications; however, the suggested range for most  indications is 2.0 - 3.0. Exceptions to the INR Reference Range may include: Prosthetic heart valves, acute myocardial infarction, prevention of myocardial infarction, and combinations of aspirin and anticoagulant. The need for a higher or lower target INR must be assessed individually. Reference: The Pharmacology and Management of the Vitamin K  antagonists: the seventh ACCP Conference on Antithrombotic and Thrombolytic Therapy. CWRUEA.5409Sept:126 (3suppl): [id number] A HCT value >55% may artifactually increase the PT.  In one study,  the increase was an average of 25%. Reference:  "Effect on Routine and Special Coagulation Testing Values of Citrate Anticoagulant Adjustment in Patients with High HCT Values." American Journal of Clinical Pathology 2006;126:400-405.)  Routine Hem:  12-Aug-14 04:03   WBC (CBC)  10.7  RBC (CBC)  3.63  Hemoglobin (CBC)  11.5  Hematocrit (CBC)  33.6  Platelet Count (CBC)  149  MCV 93  MCH 31.7  MCHC 34.3  RDW 13.5  Neutrophil % 59.9  Lymphocyte % 22.1  Monocyte % 8.5  Eosinophil % 9.2  Basophil % 0.3  Neutrophil # 6.4  Lymphocyte # 2.4  Monocyte # 0.9  Eosinophil #  1.0  Basophil # 0.0 (Result(s) reported on 19 Oct 2012 at 05:08AM.)   Assessment/Plan:  Assessment/Plan:  Assessment 1) epigastric pain and dysphagia, reported black stools.   2) abnormal ct of recto sigmiod junction.   Plan 1) egd today, I have discussed the risks benefits and complications of egd to include and  not limited to bleeding infection perforation and sedation and he  wishes to proceed.   2) colonoscopy tomorrow.   Electronic Signatures: Loistine Simas (MD)  (Signed 12-Aug-14 14:11)  Authored: Chief Complaint, VITAL SIGNS/ANCILLARY NOTES, Brief Assessment, Lab Results, Assessment/Plan   Last Updated: 12-Aug-14 14:11 by Loistine Simas (MD)

## 2014-06-30 NOTE — Discharge Summary (Signed)
PATIENT NAME:  Verita SchneidersMCMILLAN, Benen G MR#:  [id number] DATE OF BIRTH:  03/12/1950  DATE OF ADMISSION:  10/18/2012 DATE OF DISCHARGE:  10/21/2012  ADMITTING DIAGNOSIS: Diarrhea.   DISCHARGE DIAGNOSES: 1.  Diarrhea of unclear etiology at this time. Cultures for bacterial pathogens are negative.  2.  Guaiac-positive stool.  3.  Status post EGD on 10/19/2012 and colonoscopy on 10/20/2012 by Dr. Marva PandaSkulskie.  4.  Dehydration.  5.  Acute renal failure results on intravenous fluids.  6.  Diabetes mellitus with hemoglobin A1c 7.3.  7.  Respiration pneumonia after colonoscopy.  8.  History of hypertension.  9.  Diabetes.  10.  Hyperlipidemia  11.  Gout.   DISCHARGE CONDITION: Stable.   DISCHARGE MEDICATIONS: The patient is to continue lovastatin 40 mg p.o. daily, lisinopril 5 mg p.o. daily, metformin 500 mg p.o. twice daily, Celebrex 200 mg p.o. daily as needed, allopurinol 300 mg p.o. daily, Gabapentin 300 mg p.o. 3 times daily, Zofran ODT 4 mg every 4 hours as needed, oxycodone 5 mg every 6 hours as needed, hyoscyamine 0.375 mg twice daily, amoxicillin clavulanate 875 mg twice daily for 7 days, pantoprazole 40 mg p.o. daily.   HOME OXYGEN:  No.   DIET: 2 grams salt, low fat, low cholesterol, carbohydrate-controlled diet, mechanical soft.   ACTIVITY LIMITATIONS: As tolerated.   FOLLOW-UP APPOINTMENTS: With Dr. Marva PandaSkulskie in 3 to 4 days after discharge, Dr. Darreld McleanLinda Miles in 2 days after discharge.    CONSULTANTS: Dr. Marva PandaSkulskie. Ms. Vevelyn PatChristiane London, she is a Publishing rights managernurse practitioner of Dr. Reyes IvanSkulskie's, Care Management.   RADIOLOGIC STUDIES:  CT scan of abdomen and pelvis with contrast 10/14/2012 showed possible area of luminal narrowing in the rectosigmoid region on the area of image 112. Underlying carcinoma is not excluded. Possibility of spasm in this region is certainly a consideration.  Fibrotic changes in the area could also give similar appearance,  according to radiologist. Sigmoidoscopic  correlation was recommended. Findings suggesting hiatal hernia, possibility of patulous distal esophagus seen with achalasia is not excluded. Right inguinal hernia, hepatic steatosis status post cholecystectomy.  Chest x-ray, PA and lateral, 10/20/2012 showed left upper lobe and left lower lobe pneumonia. Recommend follow-up radiography to document complete resolution following adequate medical therapy. If there is no complete resolution , then recommend further evaluation with CT of the chest to exclude underlying pathology, according to radiologist.  The patient's lab data done on arrival to the hospital 10/17/2012 showed elevation of creatinine to 1.50, glucose 172, otherwise BMP was unremarkable. The patient's liver enzymes were unremarkable except a total protein was slightly elevated at 8.3. TSH was normal at 1.61.  White blood cell count was high at 15.1, hemoglobin was 14.7, platelet count 197.    HISTORY OF PRESENT ILLNESS: The patient is a 64 year old African American male with a history of diabetes mellitus who presented to the hospital with complaints of 5 days to a week duration of nausea, vomiting as well as diarrhea. Please refer to Dr. Clarita LeberVasireddy's admission on 10/18/2012.   PHYSICAL EXAMINATION:  VITAL SIGNS: On arrival to the hospital, temperature was 99.4, pulse was 93, respiration rate was 16, blood pressure 121/64.  O2  sats were 98% on room air.  Physical exam was unremarkable.   HOSPITAL COURSE:  The patient was admitted to the hospital for further evaluation. Stool cultures were taken and were negative for Salmonella, Shigella or pathogenic E. coli or Campylobacter antigen.  It was also for C. diff toxin. The patient was treated with IV  fluids but not antibiotics, and his condition (Dictation Anomaly) somewhat  improved.  A guaiac of stool was taken and was positive for blood.  At that point, a consultation with a gastroenterologist was obtained, especially in view of recent CT scan  of his abdomen and pelvis revealing abnormalities in the rectosigmoid region. Dr. Marva Panda, as well as nurse practitioner Ms. [employer], saw the patient in consultation and felt that the patient has  abdominal pain, gastroesophageal reflux disease, dysphagia, likely achalasia and guaiac positive stool and recommended comprehensive luminal evaluation, including EGD as well as colonoscopy.  The patient proceeded to upper GI endoscopy on 10/19/2012 .  It was remarkable for achalasia, normal stomach as well as normal examined duodenum. Dr. Marva Panda recommended PPI 40 mg daily and also consider Levbid twice daily until esophageal manometry is done as outpatient.  If esophageal manometry is concerning for significant spasms, pressure, then Dr. 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  At this time, the patient was self-limiting a diet due to dysphagia, and he was not losing weight. Since the patient had abnormal colon appearance in the rectosigmoid area on the CT scan, colonoscopy was also performed on 10/20/2012 by Dr. Marva Panda.  Diverticulosis of the sigmoid colon was noted. Examination otherwise was normal. Distal rectum and anal verge were normal on the  retroflexion view. Biopsies were taken with cold forceps, and the patient was discharged from endoscopy suite.  Unfortunately, during colonoscopy the patient aspirated contents from his esophagus and developed aspiration pneumonitis which was revealed by chest x-ray on 10/20/2012. The patient had chills as well as cough but no significant sputum production. His white blood cell count went up, but he was not febrile. Due to significant inflammation seen on chest x-ray, we decided to start patient on antibiotic therapy. The patient was evaluated for oxygen need.  His O2 sats remained stable on exertion as well as at rest.  They were between 93% to 95% on room air. It was felt that the patient is stable to be discharged home; however, we  recommended him to be seen by his primary care physician as well as gastroenterologist in the next few days after discharge.  In essence, the patient  presented with diarrhea. Also, stool cultures came back negative. The patient had guaiac-positive stool. He underwent EGD as well as colonoscopy. EGD was concerning for achalasia, and manometric testing was recommended to further direct the patient for possible surgery, if needed. During colonoscopy, he aspirated and developed aspiration pneumonitis.  He is to be treated for aspiration pneumonitis and follow up with gastroenterologist in the next  few days after discharge as well as his primary care physician.   In regards to chronic medical problems such as hypertension, diabetes, hyperlipidemia and gout, the patient is to continue his outpatient management. He still has some diarrhea, however, this diarrhea seemed to be subsiding and is not as watery as it has been. Of note, the patient was having some renal insufficiency, acute renal failure on arrival to the Emergency Room on 10/17/2012. At that time, the patient's creatinine was reported as 1.50.  With IV fluid administration, the patient's kidney function improved and on 10/19/2012 was normal at 1.28. It is recommended to follow the patient's kidney function as outpatient and make decisions about rehydration as outpatient, if needed.   DISCHARGE VITAL SIGNS:  On day of discharge, temperature was 98.1, pulse was 70, respirations were 18 up to 20, blood pressure 114/60, O2 sats were 93%  to 95% on room air at rest as well as on exertion. Of note, the patient was able to eat and drink. He eats at least 50% of offered meal.  His diet is being advanced to mechanical soft diet. If he does well with current diet, he will likely be discharged home today.  TIME SPENT: 40 minutes.    ____________________________ Katharina Caper, MD rv:cb D: 10/21/2012 16:51:47 ET T: 10/21/2012 22:41:43 ET JOB#: [id number]  cc: Katharina Caper, MD, <Dictator> Christena Deem, MD Leanna Sato, MD Katharina Caper MD ELECTRONICALLY SIGNED 10/27/2012 12:42

## 2014-06-30 NOTE — Consult Note (Signed)
PATIENT NAME:  Andrew Cantrell, Andrew Cantrell MR#:  [id number] DATE OF BIRTH:  February 15, 1951  DATE OF CONSULTATION:  10/18/2012  REFERRING PHYSICIAN:   CONSULTING PHYSICIAN:  Keturah Barre, NP  We appreciate this consult ordered by Dr. Winona Legato for a very pleasant 64 year old African American man for evaluation of nausea, vomiting and diarrhea. States his symptoms started a week ago Monday, developed some lower abdominal cramping followed by diarrhea, then indigestion, nausea and vomiting. Unable to identify trigger. No sick contacts or antibiotics. Feels weak, bloated and that his stomach bubbles sometimes. States that he has had up to 7 episodes of vomiting this week, either light-colored or with foodstuffs. No hematemesis. Up to 8 stools a day described as watery and green associated with nocturnal awakenings and urgency. He has not wanted to eat or leave the house due to this. Beano seems to help some but Tums exacerbates the symptoms. He states he is feeling some better today. Has not been taking PPI at home. Has had negative C. difficile test, preliminary report of negative stool culture, no white blood cells or red blood cells in the stool but has had heme-positive result for occult blood. He does report a history of dysphasia and achalasia attributed to a work-related injury 20 years ago. States this was treated with a tube and stretching but unable to elaborate on further detail. Does report a history of intermittent dysphagia that is worse with his current condition, worse with pills, solid and soft foods okay. He also reports some heartburn and indigestion. Does not take many NSAIDs at home. Do note apple core-type lesion to the rectosigmoid colon and a patulous area in the distal esophagus consistent with achalasia. His labs are stable. He is currently being maintained on IV PPI and a clear liquid diet and says he feels somewhat better since he has been hospitalized.   PAST MEDICAL HISTORY:   Achalasia, hypertension, diabetes, hyperlipidemia, gout, gallstones, cholecystectomy, pancreatitis not related to alcohol.   ALLERGIES:  MORPHINE, TYLENOL, ZOFRAN.   HOME MEDICATIONS:  Zofran 4 mg q.4 hours p.r.n., oxycodone 5 mg p.o. every 6 hours p.r.n. This was given to him from his ED visit Wednesday or Thursday. Also takes regularly metformin 500 mg b.i.d., lovastatin 40 mg p.o. at bedtime, lisinopril 5 mg p.o. daily, gabapentin 300 mg t.i.d., Celebrex 200 mg p.r.n., allopurinol dose unknown daily.   SOCIAL HISTORY: Denies alcohol, tobacco or illicits. Married lives with wife. Works as a Administrator.   FAMILY HISTORY: Negative for colorectal cancer, colon polyps, liver disease or ulcers. Family history significant for diabetes, hypertension, cardiac disease.   REVIEW OF SYSTEMS: Ten systems reviewed. Significant for generalized weakness, otherwise unremarkable other than what is noted above.   MOST RECENT LABS: Glucose 105, BUN 13, creatinine 1.37, sodium 145, GFR greater than 60, calcium 7.9. A1c 7.3. Lipase 317, total protein 8.3, albumin 3.7, total bilirubin 0.5, ALT 95, AST 28, ALT 30. TSH 1.61. WBC 15.3, hemoglobin 12.2, hematocrit 36, platelets 160, red cells normocytic. Negative C. diff.,  no WBC or RBC in stools, culture negative so far, heme-positive result. CT contrast demonstrating fatty liver, apple core lesion to the rectosigmoid colon 2.1 cm in size, hiatal hernia with patulous distal esophagus consistent with achalasia and a right inguinal hernia.   MOST RECENT VITAL SIGNS: Temperature 98.4, pulse 68, respiratory rate 20, BP 106/64, SaO2  98%.   IMPRESSION AND PLAN: Abdominal pain, gastroesophageal reflux disease, dysphagia, diarrhea, abnormal CT findings, heme-positive stool. The patient feeling somewhat better  but agree with proton pump inhibitor. Do recommend luminal evaluation consisting of esophagogastroduodenoscopy and colonoscopy. Due to the nature of his complaints, we will  begin with an esophagogastroduodenoscopy tomorrow and follow this with a colonoscopy the next day. Risks and benefits of the procedures and indications have been explained. He is agreeable.   These services were provided by Andrew Pathristiane Graig Hessling, MSN, [facility], in collaboration with Andrew Cantrell, M.D., with whom I have discussed this patient in full. Thank you very much for this consult.  ____________________________ Keturah Barrehristiane H. Danniel Tones, NP chl:cs D: 10/18/2012 18:05:00 ET T: 10/18/2012 18:39:19 ET JOB#: [id number]  cc: Keturah Barrehristiane H. Veanna Dower, NP, <Dictator> Eustaquio MaizeHRISTIANE H Dru Primeau FNP ELECTRONICALLY SIGNED 11/05/2012 8:48

## 2014-06-30 NOTE — Consult Note (Signed)
Chief Complaint:  Subjective/Chief Complaint seen for dysphagia and diarrhea.  patient with achalasia.   VITAL SIGNS/ANCILLARY NOTES: **Vital Signs.:   14-Aug-14 04:09  Vital Signs Type Routine  Temperature Temperature (F) 98.1  Celsius 36.7  Temperature Source oral  Pulse Pulse 92  Respirations Respirations 20  Systolic BP Systolic BP 108  Diastolic BP (mmHg) Diastolic BP (mmHg) 63  Mean BP 78  Pulse Ox % Pulse Ox % 92  Pulse Ox Activity Level  At rest  Oxygen Delivery Room Air/ 21 %    12:15  Pulse Ox % Pulse Ox % 95  Pulse Ox Activity Level  With exertion; amb in hall   Brief Assessment:  Cardiac Regular   Respiratory clear BS   Gastrointestinal details normal Soft  Nondistended  No masses palpable  Bowel sounds normal  No gaurding  mild diffuse discomfort   Lab Results:  Routine Hem:  14-Aug-14 04:06   WBC (CBC)  16.4 (Result(s) reported on 21 Oct 2012 at 04:59AM.)   Assessment/Plan:  Invasive Device Daily Assessment of Necessity:  Does the patient currently have any of the following indwelling devices? 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  Patient has been self limiting diet due to dysphagia,  but has not been losing weight.  2) abnormal CT- no lesion on colonoscopy.  mucosal biopsies taken, pending.   Plan 1) further evaluation as above.  will need o/p GI fu within a week to arrange for the further testing.  diet as tolerated. continue ppi. have patient take [employer] colon heqalth probiotic as o/p as directed. [facility].   Electronic Signatures: Barnetta ChapelSkulskie, Partick Musselman (MD)  (Signed 14-Aug-14 13:52)  Authored: Chief Complaint, VITAL SIGNS/ANCILLARY NOTES, Brief Assessment, Lab Results, Assessment/Plan   Last Updated: 14-Aug-14 13:52 by Barnetta ChapelSkulskie, Jaslynne Dahan (MD)

## 2014-06-30 NOTE — Consult Note (Signed)
Chief Complaint:  Subjective/Chief Complaint Patient seen adn examined, please see full GI consult and brief consult note.  Patient presenting with diarrheal illness over the past 2 weeks and lower abdominal pain intermittantly for about 2 months.  Abnormal CT noted, with possible napkin ring lesion in the rectosigmoid.  Patient also having alot of problems with dysphagia in the setting of h/o achalasia with possible rigiflex dilation at [facility] many years ago.  Currently having some dysphagia sx, with alteration of diet and occasional regurgitation of foods.  States he has been seeing black stools, but denies bright red bleeding.  Recommend continuing iv ppi, egd tomorrow to evaluate gastric and esophageal lumen then colonoscopy the next day.  I have discussed the risks benefits and complications of egd and colonoscopy to include not limited to bleeding infection perforation and sedation and he wishes to proceed.   VITAL SIGNS/ANCILLARY NOTES: **Vital Signs.:   11-Aug-14 15:18  Vital Signs Type Routine  Temperature Temperature (F) 98.4  Celsius 36.8  Temperature Source oral  Respirations Respirations 20  Systolic BP Systolic BP 106  Diastolic BP (mmHg) Diastolic BP (mmHg) 64  Mean BP 78  Pulse Ox % Pulse Ox % 98  Pulse Ox Activity Level  At rest  Oxygen Delivery Room Air/ 21 %   Brief Assessment:  Cardiac Regular   Respiratory clear BS   Gastrointestinal details normal Soft  Nontender  Nondistended  No masses palpable  Bowel sounds normal   Lab Results: Routine Micro:  10-Aug-14 22:02   Micro Text Report CLOS.DIFF ASSAY, RT-PCR   COMMENT                   NEGATIVE-CLOS.DIFFICILE TOXIN NOT DETECTED BY PCR   ANTIBIOTIC                       Micro Text Report WBCS, STOOL   COMMENT                   NO RBC'S OR WBC'S SEEN   ANTIBIOTIC                       Micro Text Report STOOL COMPREHENSIVE   COMMENT                   HOLDING FOR POSSIBLE PATHOGEN   COMMENT                   NO  PATHOGENIC E.COLI DETECTED   COMMENT                   NO CAMPYLOBACTER ANTIGEN DETECTED   ANTIBIOTIC                        Comment  1. NEGATIVE-CLOS.DIFFICILE TOXIN NOT DETECTED BY PCR ---------------------------------- Test procedure integrates sample purification, nucleic acid amplification, and detection of the target Clostridium difficile sequence in simple or complex samplesusing real-time PCR and RT-PCR assays.  Comment .1. NO RBC'S OR WBC'S SEEN  Result(s) reported on 18 Oct 2012 at 11:40AM.  Culture Comment HOLDING FOR POSSIBLE PATHOGEN  Culture Comment . NO PATHOGENIC E.COLI DETECTED  Culture Comment    . NO CAMPYLOBACTER ANTIGEN DETECTED  Result(s) reported on 18 Oct 2012 at 11:33AM.  Routine Hem:  11-Aug-14 04:06   WBC (CBC)  15.3  RBC (CBC)  3.93  Hemoglobin (CBC)  12.2  Hematocrit (CBC)  36.0  MCV  92   Electronic Signatures: Barnetta ChapelSkulskie, Martin (MD)  (Signed 11-Aug-14 18:51)  Authored: Chief Complaint, VITAL SIGNS/ANCILLARY NOTES, Brief Assessment, Lab Results   Last Updated: 11-Aug-14 18:51 by Barnetta ChapelSkulskie, Martin (MD)

## 2016-04-18 ENCOUNTER — Ambulatory Visit: Payer: Medicare HMO | Admitting: Urology

## 2016-06-08 ENCOUNTER — Emergency Department
Admission: EM | Admit: 2016-06-08 | Discharge: 2016-06-08 | Disposition: A | Discharge status: Home / Self Care | Payer: Medicare HMO | Attending: Emergency Medicine | Admitting: Emergency Medicine

## 2016-06-08 ENCOUNTER — Encounter: Payer: Self-pay | Admitting: Emergency Medicine

## 2016-06-08 ENCOUNTER — Emergency Department: Payer: Medicare HMO

## 2016-06-08 DIAGNOSIS — Z79899 Other long term (current) drug therapy: Secondary | ICD-10-CM | POA: Diagnosis not present

## 2016-06-08 DIAGNOSIS — R05 Cough: Secondary | ICD-10-CM | POA: Diagnosis present

## 2016-06-08 DIAGNOSIS — Z7984 Long term (current) use of oral hypoglycemic drugs: Secondary | ICD-10-CM | POA: Insufficient documentation

## 2016-06-08 DIAGNOSIS — E119 Type 2 diabetes mellitus without complications: Secondary | ICD-10-CM | POA: Insufficient documentation

## 2016-06-08 DIAGNOSIS — J40 Bronchitis, not specified as acute or chronic: Secondary | ICD-10-CM | POA: Insufficient documentation

## 2016-06-08 HISTORY — DX: Type 2 diabetes mellitus without complications: E11.9

## 2016-06-08 MED ORDER — AZITHROMYCIN 250 MG PO TABS: ORAL_TABLET | ORAL | 0 refills | Status: DC

## 2016-06-08 MED ORDER — ALBUTEROL SULFATE HFA 108 (90 BASE) MCG/ACT IN AERS: 2.0000 | INHALATION_SPRAY | Freq: Four times a day (QID) | RESPIRATORY_TRACT | 0 refills | Status: DC | PRN

## 2016-06-08 NOTE — ED Provider Notes (Signed)
[facility] Emergency Department Provider Note  ____________________________________________  Time seen: Approximately 7:30 AM  I have reviewed the triage vital signs and the nursing notes.   HISTORY  Chief Complaint Cough    HPI Andrew Cantrell is a 66 y.o. male that presents to the emergency department if cough and congestion for 3 weeks. Patient states that last night he had chills but did not take his temperature. He feels like he needs to cough something up but is having difficulty coughing it up. He describes the cough as "sticky." The cough is making it difficult for him to sleep at night. Patient has taken TheraFlu for symptoms, which made him tired. Patient does not smoke. He denies COPD, asthma, allergies. He has type 2 diabetes. He denies headache, visual changes, facial pain, sore throat, redness of breath, chest pain, nausea, vomiting, abdominal pain.   Past Medical History:  Diagnosis Date  . Diabetes mellitus without complication (HCC)     There are no active problems to display for this patient.   Past Surgical History:  Procedure Laterality Date  . CHOLECYSTECTOMY      Prior to Admission medications   Medication Sig Start Date End Date Taking? Authorizing Provider  atorvastatin (LIPITOR) 20 MG tablet Take 20 mg by mouth daily.   Yes Historical Provider, MD  lisinopril (PRINIVIL,ZESTRIL) 20 MG tablet Take 20 mg by mouth daily.   Yes Historical Provider, MD  metFORMIN (GLUCOPHAGE) 500 MG tablet Take by mouth 2 (two) times daily with a meal.   Yes Historical Provider, MD  albuterol (PROVENTIL HFA;VENTOLIN HFA) 108 (90 Base) MCG/ACT inhaler Inhale 2 puffs into the lungs every 6 (six) hours as needed for wheezing or shortness of breath. 06/08/16   Enid Derry, PA-C  azithromycin (ZITHROMAX Z-PAK) 250 MG tablet Take 2 tablets (500 mg) on  Day 1,  followed by 1 tablet (250 mg) once daily on Days 2 through 5. 06/08/16   Enid Derry, PA-C     Allergies Morphine and related  No family history on file.  Social History Social History  Substance Use Topics  . Smoking status: Never Smoker  . Smokeless tobacco: Never Used  . Alcohol use Yes     Review of Systems  Constitutional: Positive for fever/chills Eyes: No visual changes. No discharge. ENT: Positive for congestion and rhinorrhea. Cardiovascular: No chest pain. Respiratory: Positive for cough. No SOB. Gastrointestinal: No abdominal pain.  No nausea, no vomiting.  No diarrhea.  No constipation. Musculoskeletal: Negative for musculoskeletal pain. Skin: Negative for rash, abrasions, lacerations, ecchymosis. Neurological: Negative for headaches.   ____________________________________________   PHYSICAL EXAM:  VITAL SIGNS: ED Triage Vitals  Enc Vitals Group     BP 06/08/16 0543 95/80     Pulse Rate 06/08/16 0542 71     Resp 06/08/16 0542 18     Temp 06/08/16 0542 97.9 F (36.6 C)     Temp Source 06/08/16 0542 Oral     SpO2 06/08/16 0542 96 %     Weight 06/08/16 0542 158 lb (71.7 kg)     Height 06/08/16 0542  (1.626 m)     Head Circumference --      Peak Flow --      Pain Score --      Pain Loc --      Pain Edu? --      Excl. in GC? --      Constitutional: Alert and oriented. Well appearing and in no acute  distress. Eyes: Conjunctivae are normal. PERRL. EOMI. No discharge. Head: Atraumatic. ENT: No frontal and maxillary sinus tenderness.      Ears: Tympanic membranes pearly gray with good landmarks. No discharge.      Nose: Mild congestion/rhinnorhea.      Mouth/Throat: Mucous membranes are moist. Oropharynx non-erythematous.  Neck: No stridor.   Hematological/Lymphatic/Immunilogical: No cervical lymphadenopathy. Cardiovascular: Normal rate, regular rhythm.  Good peripheral circulation. Respiratory: Normal respiratory effort without tachypnea or retractions. Lungs CTAB. Good air entry to the bases with no decreased or absent breath  sounds. Gastrointestinal: Bowel sounds 4 quadrants. Soft and nontender to palpation. No guarding or rigidity. No palpable masses. No distention. Musculoskeletal: Full range of motion to all extremities. No gross deformities appreciated. Neurologic:  Normal speech and language. No gross focal neurologic deficits are appreciated.  Skin:  Skin is warm, dry and intact. No rash noted.   ____________________________________________   LABS (all labs ordered are listed, but only abnormal results are displayed)  Labs Reviewed - No data to display ____________________________________________  EKG   ____________________________________________  RADIOLOGY Lexine Baton, personally viewed and evaluated these images (plain radiographs) as part of my medical decision making, as well as reviewing the written report by the radiologist.  Dg Chest 2 View  Result Date: 06/08/2016 CLINICAL DATA:  Cough and flu-like symptoms for 3 weeks. EXAM: CHEST  2 VIEW COMPARISON:  10/20/2012 FINDINGS: The cardiomediastinal contours are normal. Streaky atelectasis or scarring in the right greater than left lung base. No consolidation to suggest pneumonia. Pulmonary vasculature is normal. No pleural effusion or pneumothorax. No acute osseous abnormalities are seen. IMPRESSION: Streaky bibasilar atelectasis or scarring. No evidence of pneumonia. Electronically Signed   By: Rubye Oaks M.D.   On: 06/08/2016 06:28    ____________________________________________    PROCEDURES  Procedure(s) performed:    Procedures    Medications - No data to display   ____________________________________________   INITIAL IMPRESSION / ASSESSMENT AND PLAN / ED COURSE  Pertinent labs & imaging results that were available during my care of the patient were reviewed by me and considered in my medical decision making (see chart for details).  Review of the [place] CSRS was performed in accordance of the [facility] prior to  dispensing any controlled drugs.     Patient's diagnosis is consistent with bronchitis. Vital signs and exam are reassuring. No acute cardiopulmonary processes indicated on x-ray. Patient appears well and is staying well hydrated. Patient feels comfortable going home. Patient has diabetes so we discussed refraining from prednisone at this time. Patient will be discharged home with prescriptions for azithromycin and albuterol inhaler. Patient will follow-up with his PCP in the middle of the week if his symptoms are not improving. Patient is given ED precautions to return to the ED for any worsening or new symptoms.     ____________________________________________  FINAL CLINICAL IMPRESSION(S) / ED DIAGNOSES  Final diagnoses:  Bronchitis      NEW MEDICATIONS STARTED DURING THIS VISIT:  New Prescriptions   ALBUTEROL (PROVENTIL HFA;VENTOLIN HFA) 108 (90 BASE) MCG/ACT INHALER    Inhale 2 puffs into the lungs every 6 (six) hours as needed for wheezing or shortness of breath.   AZITHROMYCIN (ZITHROMAX Z-PAK) 250 MG TABLET    Take 2 tablets (500 mg) on  Day 1,  followed by 1 tablet (250 mg) once daily on Days 2 through 5.        This chart was dictated using voice recognition software/Dragon. Despite best efforts to  proofread, errors can occur which can change the meaning. Any change was purely unintentional.    Enid Derry, PA-C 06/08/16 1610    Arnaldo Natal, MD 06/08/16 1540

## 2016-06-08 NOTE — ED Notes (Signed)
See triage note  States he developed a cough about 3 weeks ago  Unsure of fever but has had chills   Afebrile on arrival .states cough is non productive

## 2016-06-08 NOTE — ED Triage Notes (Signed)
Patient with complaint of cough times three weeks. Patient reports that he is unable to sleep from the cough. Patient denies fever but states that he had chills earlier tonight.

## 2017-05-04 ENCOUNTER — Ambulatory Visit: Payer: Self-pay | Admitting: Podiatry

## 2017-05-18 ENCOUNTER — Ambulatory Visit: Payer: Self-pay | Admitting: Podiatry

## 2017-07-01 ENCOUNTER — Other Ambulatory Visit: Payer: Self-pay

## 2017-07-01 ENCOUNTER — Emergency Department
Admission: EM | Admit: 2017-07-01 | Discharge: 2017-07-01 | Disposition: A | Discharge status: Home / Self Care | Payer: Medicare HMO | Attending: Emergency Medicine | Admitting: Emergency Medicine

## 2017-07-01 ENCOUNTER — Encounter: Payer: Self-pay | Admitting: Emergency Medicine

## 2017-07-01 ENCOUNTER — Emergency Department: Payer: Medicare HMO

## 2017-07-01 DIAGNOSIS — E119 Type 2 diabetes mellitus without complications: Secondary | ICD-10-CM | POA: Insufficient documentation

## 2017-07-01 DIAGNOSIS — J40 Bronchitis, not specified as acute or chronic: Secondary | ICD-10-CM | POA: Diagnosis not present

## 2017-07-01 DIAGNOSIS — J029 Acute pharyngitis, unspecified: Secondary | ICD-10-CM | POA: Insufficient documentation

## 2017-07-01 DIAGNOSIS — Z7984 Long term (current) use of oral hypoglycemic drugs: Secondary | ICD-10-CM | POA: Diagnosis not present

## 2017-07-01 DIAGNOSIS — Z79899 Other long term (current) drug therapy: Secondary | ICD-10-CM | POA: Diagnosis not present

## 2017-07-01 HISTORY — DX: Pure hypercholesterolemia, unspecified: E78.00

## 2017-07-01 LAB — GROUP A STREP BY PCR: Group A Strep by PCR: NOT DETECTED

## 2017-07-01 LAB — GLUCOSE, CAPILLARY: Glucose-Capillary: 124 mg/dL — ABNORMAL HIGH (ref 65–99)

## 2017-07-01 MED ORDER — BENZONATATE 100 MG PO CAPS: 100.0000 mg | ORAL_CAPSULE | Freq: Three times a day (TID) | ORAL | 0 refills | Status: DC | PRN

## 2017-07-01 MED ORDER — ALBUTEROL SULFATE HFA 108 (90 BASE) MCG/ACT IN AERS: 2.0000 | INHALATION_SPRAY | Freq: Four times a day (QID) | RESPIRATORY_TRACT | 0 refills | Status: DC | PRN

## 2017-07-01 MED ORDER — IPRATROPIUM-ALBUTEROL 0.5-2.5 (3) MG/3ML IN SOLN
3.0000 mL | Freq: Once | RESPIRATORY_TRACT | Status: AC
  Administered 2017-07-01: 3 mL via RESPIRATORY_TRACT
  Filled 2017-07-01: qty 3

## 2017-07-01 MED ORDER — PREDNISONE 10 MG PO TABS: ORAL_TABLET | ORAL | 0 refills | Status: DC

## 2017-07-01 MED ORDER — AZITHROMYCIN 250 MG PO TABS: ORAL_TABLET | ORAL | 0 refills | Status: DC

## 2017-07-01 NOTE — ED Notes (Signed)
See triage note  States he developed cough and subjective fever about 2 months ago  Afebrile on arrival   Cough has been intermittently prod  Dry cough noted on arrival

## 2017-07-01 NOTE — ED Notes (Signed)
Pt given warm blanket and a pillow. 

## 2017-07-01 NOTE — ED Triage Notes (Addendum)
Pt presents to ED with frequent productive cough, sore throat, and congestion for the past 4-6 weeks. Pt states tonight was unable to sleep due to his currently illness. Intermittent fever. Not  Currently.

## 2017-07-01 NOTE — ED Provider Notes (Signed)
[facility] Emergency Department Provider Note  ____________________________________________  Time seen: Approximately 7:33 AM  I have reviewed the triage vital signs and the nursing notes.   HISTORY  Chief Complaint Cough and Nasal Congestion    HPI Andrew Cantrell is a 67 y.o. male that presents to the emergency department for evaluation of nasal congestion, sore throat, productive cough with yellow and clear sputum for 1.5 months. He has had intermittent nausea and vomiting. Symptoms seemed to get better but then returned. Symptoms worsened last night. He has had chills but not taken his temperature. He has tried Robitussin, sinus cold, nyquil without improvement. People at work have had colds. He has had bronchitis. No history of COPD, CHF. No ear pain, hemoptysis, pink tinged sputum, abdominal pain, diarrhea, weight change, edema.   Past Medical History:  Diagnosis Date  . Diabetes mellitus without complication (HCC)   . Hypercholesteremia     There are no active problems to display for this patient.   Past Surgical History:  Procedure Laterality Date  . CHOLECYSTECTOMY    . HERNIA REPAIR      Prior to Admission medications   Medication Sig Start Date End Date Taking? Authorizing Provider  albuterol (PROVENTIL HFA;VENTOLIN HFA) 108 (90 Base) MCG/ACT inhaler Inhale 2 puffs into the lungs every 6 (six) hours as needed for wheezing or shortness of breath. 07/01/17   Enid Derry, PA-C  atorvastatin (LIPITOR) 20 MG tablet Take 20 mg by mouth daily.    [provider]  azithromycin (ZITHROMAX Z-PAK) 250 MG tablet Take 2 tablets (500 mg) on  Day 1,  followed by 1 tablet (250 mg) once daily on Days 2 through 5. 07/01/17   Enid Derry, PA-C  benzonatate (TESSALON PERLES) 100 MG capsule Take 1 capsule (100 mg total) by mouth 3 (three) times daily as needed for cough. 07/01/17 07/01/18  Enid Derry, PA-C  lisinopril (PRINIVIL,ZESTRIL) 20  MG tablet Take 20 mg by mouth daily.    [provider]  metFORMIN (GLUCOPHAGE) 500 MG tablet Take by mouth 2 (two) times daily with a meal.    [provider]  predniSONE (DELTASONE) 10 MG tablet Take 6 tablets on day 1, take 5 tablets on day 2, take 4 tablets on day 3, take 3 tablets on day 4, take 2 tablets on day 5, take 1 tablet on day 6 07/01/17   Enid Derry, PA-C    Allergies Morphine and related  No family history on file.  Social History Social History   Tobacco Use  . Smoking status: Never Smoker  . Smokeless tobacco: Never Used  Substance Use Topics  . Alcohol use: Yes  . Drug use: Never     Review of Systems  ENT: Positive for nasal congestion Cardiovascular: No chest pain. Respiratory: Positive for cough. No SOB. Gastrointestinal: No abdominal pain.  Musculoskeletal: Negative for musculoskeletal pain. Skin: Negative for rash, abrasions, lacerations, ecchymosis. Neurological: Negative for headaches, numbness or tingling   ____________________________________________   PHYSICAL EXAM:  VITAL SIGNS: ED Triage Vitals  Enc Vitals Group     BP 07/01/17 0343 (!) 136/52     Pulse Rate 07/01/17 0343 (!) 101     Resp 07/01/17 0343 20     Temp 07/01/17 0343 98.8 F (37.1 C)     Temp Source 07/01/17 0343 Oral     SpO2 07/01/17 0343 97 %     Weight 07/01/17 0344 156 lb (70.8 kg)     Height 07/01/17  0344 5\' 3"  (1.6 m)     Head Circumference --      Peak Flow --      Pain Score 07/01/17 0343 10     Pain Loc --      Pain Edu? --      Excl. in GC? --      Constitutional: Alert and oriented. Well appearing and in no acute distress. Eyes: Conjunctivae are normal. PERRL. EOMI. Head: Atraumatic. ENT:      Ears: Tympanic membranes erythematous.       Nose: Mild congestion/rhinnorhea.      Mouth/Throat: Mucous membranes are moist. Oropharynx non erythematous. Uvula midline. Tonsils not enlarged.  Neck: No stridor.  Cardiovascular: Normal  rate, regular rhythm.  Good peripheral circulation. Respiratory: Normal respiratory effort without tachypnea or retractions. Lungs CTAB. Good air entry to the bases with no decreased or absent breath sounds. Gastrointestinal: Bowel sounds 4 quadrants. Soft and nontender to palpation. No guarding or rigidity. No palpable masses. No distention.  Musculoskeletal: Full range of motion to all extremities. No gross deformities appreciated. Neurologic:  Normal speech and language. No gross focal neurologic deficits are appreciated.  Skin:  Skin is warm, dry and intact. No rash noted.   ____________________________________________   LABS (all labs ordered are listed, but only abnormal results are displayed)  Labs Reviewed  GLUCOSE, CAPILLARY - Abnormal; Notable for the following components:      Result Value   Glucose-Capillary 124 (*)    All other components within normal limits  GROUP A STREP BY PCR  CBG MONITORING, ED   ____________________________________________  EKG   ____________________________________________  RADIOLOGY Lexine BatonI, Lakashia Collison, personally viewed and evaluated these images (plain radiographs) as part of my medical decision making, as well as reviewing the written report by the radiologist.  Dg Chest 2 View  Result Date: 07/01/2017 CLINICAL DATA:  Cough for 1 month EXAM: CHEST - 2 VIEW COMPARISON:  June 08, 2016 FINDINGS: There are areas of mild scarring bilaterally. No edema or consolidation. The heart size and pulmonary vascularity are normal. No adenopathy. There is degenerative change in the thoracic spine. IMPRESSION: No edema or consolidation. Stable areas of scattered scarring bilaterally. Stable cardiac silhouette. Electronically Signed   By: Bretta BangWilliam  Woodruff III M.D.   On: 07/01/2017 07:41    ____________________________________________    PROCEDURES  Procedure(s) performed:    Procedures    Medications  ipratropium-albuterol (DUONEB) 0.5-2.5 (3)  MG/3ML nebulizer solution 3 mL (3 mLs Nebulization Given 07/01/17 0742)     ____________________________________________   INITIAL IMPRESSION / ASSESSMENT AND PLAN / ED COURSE  Pertinent labs & imaging results that were available during my care of the patient were reviewed by me and considered in my medical decision making (see chart for details).  Review of the [place] CSRS was performed in accordance of the [facility] prior to dispensing any controlled drugs.   Patient's diagnosis is consistent with bronchitis.  Vital signs and exam are reassuring.  Chest x-ray consistent with previous.  Patient felt better after DuoNeb.  Patient will be discharged home with prescriptions for prednisone, azithromycin, Tessalon Perles, albuterol. Patient is to follow up with PCP as directed. Patient is given ED precautions to return to the ED for any worsening or new symptoms.     ____________________________________________  FINAL CLINICAL IMPRESSION(S) / ED DIAGNOSES  Final diagnoses:  Bronchitis      NEW MEDICATIONS STARTED DURING THIS VISIT:  ED Discharge Orders  Ordered    predniSONE (DELTASONE) 10 MG tablet     07/01/17 0827    azithromycin (ZITHROMAX Z-PAK) 250 MG tablet     07/01/17 0827    benzonatate (TESSALON PERLES) 100 MG capsule  3 times daily PRN     07/01/17 0827    albuterol (PROVENTIL HFA;VENTOLIN HFA) 108 (90 Base) MCG/ACT inhaler  Every 6 hours PRN     07/01/17 0827          This chart was dictated using voice recognition software/Dragon. Despite best efforts to proofread, errors can occur which can change the meaning. Any change was purely unintentional.    Enid Derry, PA-C 07/01/17 1610    Phineas Semen, MD 07/01/17 [id number]

## 2017-08-06 ENCOUNTER — Other Ambulatory Visit: Payer: Self-pay | Admitting: Gastroenterology

## 2017-08-06 DIAGNOSIS — R140 Abdominal distension (gaseous): Secondary | ICD-10-CM

## 2017-08-06 DIAGNOSIS — R1110 Vomiting, unspecified: Secondary | ICD-10-CM

## 2017-08-06 DIAGNOSIS — R1310 Dysphagia, unspecified: Secondary | ICD-10-CM

## 2017-08-11 ENCOUNTER — Other Ambulatory Visit: Payer: Self-pay | Admitting: Gastroenterology

## 2017-08-11 ENCOUNTER — Ambulatory Visit
Admission: RE | Admit: 2017-08-11 | Discharge: 2017-08-11 | Disposition: A | Discharge status: Home / Self Care | Payer: Medicare HMO | Source: Ambulatory Visit | Attending: Gastroenterology | Admitting: Gastroenterology

## 2017-08-11 DIAGNOSIS — R1110 Vomiting, unspecified: Secondary | ICD-10-CM

## 2017-08-11 DIAGNOSIS — R1310 Dysphagia, unspecified: Secondary | ICD-10-CM | POA: Diagnosis present

## 2017-08-11 DIAGNOSIS — R140 Abdominal distension (gaseous): Secondary | ICD-10-CM

## 2017-08-18 ENCOUNTER — Ambulatory Visit: Payer: Medicare HMO | Admitting: Anesthesiology

## 2017-08-18 ENCOUNTER — Encounter: Payer: Self-pay | Admitting: *Deleted

## 2017-08-18 ENCOUNTER — Ambulatory Visit
Admission: RE | Admit: 2017-08-18 | Discharge: 2017-08-18 | Disposition: A | Discharge status: Home / Self Care | Payer: Medicare HMO | Source: Ambulatory Visit | Attending: Gastroenterology | Admitting: Gastroenterology

## 2017-08-18 ENCOUNTER — Encounter
Admission: RE | Disposition: A | Discharge status: Home / Self Care | Payer: Self-pay | Source: Ambulatory Visit | Attending: Gastroenterology

## 2017-08-18 DIAGNOSIS — K3189 Other diseases of stomach and duodenum: Secondary | ICD-10-CM | POA: Diagnosis not present

## 2017-08-18 DIAGNOSIS — Z7982 Long term (current) use of aspirin: Secondary | ICD-10-CM | POA: Diagnosis not present

## 2017-08-18 DIAGNOSIS — Z79899 Other long term (current) drug therapy: Secondary | ICD-10-CM | POA: Insufficient documentation

## 2017-08-18 DIAGNOSIS — K2950 Unspecified chronic gastritis without bleeding: Secondary | ICD-10-CM | POA: Insufficient documentation

## 2017-08-18 DIAGNOSIS — Z87891 Personal history of nicotine dependence: Secondary | ICD-10-CM | POA: Insufficient documentation

## 2017-08-18 DIAGNOSIS — I10 Essential (primary) hypertension: Secondary | ICD-10-CM | POA: Insufficient documentation

## 2017-08-18 DIAGNOSIS — E7800 Pure hypercholesterolemia, unspecified: Secondary | ICD-10-CM | POA: Diagnosis not present

## 2017-08-18 DIAGNOSIS — Z7984 Long term (current) use of oral hypoglycemic drugs: Secondary | ICD-10-CM | POA: Insufficient documentation

## 2017-08-18 DIAGNOSIS — M109 Gout, unspecified: Secondary | ICD-10-CM | POA: Insufficient documentation

## 2017-08-18 DIAGNOSIS — E119 Type 2 diabetes mellitus without complications: Secondary | ICD-10-CM | POA: Insufficient documentation

## 2017-08-18 DIAGNOSIS — R1310 Dysphagia, unspecified: Secondary | ICD-10-CM | POA: Diagnosis present

## 2017-08-18 HISTORY — DX: Essential (primary) hypertension: I10

## 2017-08-18 HISTORY — PX: ESOPHAGOGASTRODUODENOSCOPY (EGD) WITH PROPOFOL: SHX5813

## 2017-08-18 HISTORY — DX: Gout, unspecified: M10.9

## 2017-08-18 LAB — GLUCOSE, CAPILLARY: GLUCOSE-CAPILLARY: 96 mg/dL (ref 65–99)

## 2017-08-18 SURGERY — ESOPHAGOGASTRODUODENOSCOPY (EGD) WITH PROPOFOL
Anesthesia: General

## 2017-08-18 MED ORDER — PROPOFOL 500 MG/50ML IV EMUL
INTRAVENOUS | Status: DC | PRN
  Administered 2017-08-18: 130 ug/kg/min via INTRAVENOUS

## 2017-08-18 MED ORDER — PROPOFOL 500 MG/50ML IV EMUL
INTRAVENOUS | Status: AC
  Filled 2017-08-18: qty 50

## 2017-08-18 MED ORDER — PROPOFOL 10 MG/ML IV BOLUS
INTRAVENOUS | Status: DC | PRN
  Administered 2017-08-18: 20 mg via INTRAVENOUS
  Administered 2017-08-18: 80 mg via INTRAVENOUS
  Administered 2017-08-18: 20 mg via INTRAVENOUS

## 2017-08-18 MED ORDER — MIDAZOLAM HCL 2 MG/2ML IJ SOLN
INTRAMUSCULAR | Status: AC
  Filled 2017-08-18: qty 2

## 2017-08-18 MED ORDER — SODIUM CHLORIDE 0.9 % IV SOLN
INTRAVENOUS | Status: DC
  Administered 2017-08-18: 10:00:00 via INTRAVENOUS

## 2017-08-18 MED ORDER — FENTANYL CITRATE (PF) 100 MCG/2ML IJ SOLN
INTRAMUSCULAR | Status: AC
  Filled 2017-08-18: qty 2

## 2017-08-18 MED ORDER — MIDAZOLAM HCL 5 MG/5ML IJ SOLN
INTRAMUSCULAR | Status: DC | PRN
  Administered 2017-08-18: 1 mg via INTRAVENOUS

## 2017-08-18 MED ORDER — FENTANYL CITRATE (PF) 100 MCG/2ML IJ SOLN
INTRAMUSCULAR | Status: DC | PRN
  Administered 2017-08-18 (×2): 50 ug via INTRAVENOUS

## 2017-08-18 MED ORDER — PHENYLEPHRINE HCL 10 MG/ML IJ SOLN
INTRAMUSCULAR | Status: DC | PRN
  Administered 2017-08-18: 100 ug via INTRAVENOUS

## 2017-08-18 MED ORDER — LIDOCAINE 2% (20 MG/ML) 5 ML SYRINGE
INTRAMUSCULAR | Status: DC | PRN
  Administered 2017-08-18: 30 mg via INTRAVENOUS

## 2017-08-18 NOTE — H&P (Signed)
Outpatient short stay form Pre-procedure 08/18/2017 10:11 AM  Andrew DeemMartin Cantrell Gay Rape Cantrell  Primary Physician: Dr. Darreld McleanLinda Cantrell  Reason for visit: EGD  History of present illness: Patient is a 67 year old male presenting today complaining of dysphagia bloating and vomiting.  He will awaken at night with food coming up.  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  He seemed to be doing moderately well till several months ago.  Currently is having much more issues with dysphagia.  Have a barium swallow on 08/11/2017 that showed a markedly dilated thoracic esophagus and irregular mucosal surfaces or possibly retained food particles consistent with achalasia.  No normal esophageal motility observed caliber of the esophagus was normalized just proximal to the GE junction no evidence of perforation.  The stomach was grossly normal.  Assessment was consistent with achalasia. Patient does take 81 mg aspirin that is been held.  He takes no other aspirin products or blood thinner   Current Facility-Administered Medications:  .  0.9 %  sodium chloride infusion, , Intravenous, Continuous, Andrew Cantrell, Andrew Vassell Cantrell, Cantrell  Medications Prior to Admission  Medication Sig Dispense Refill Last Dose  . albuterol (PROVENTIL HFA;VENTOLIN HFA) 108 (90 Base) MCG/ACT inhaler Inhale 2 puffs into the lungs every 6 (six) hours as needed for wheezing or shortness of breath. 1 Inhaler 0 Past Month at Unknown time  . aspirin EC 81 MG tablet Take 81 mg by mouth daily.   Past Week at Unknown time  . atorvastatin (LIPITOR) 20 MG tablet Take 20 mg by mouth daily.   Past Week at Unknown time  . lisinopril (PRINIVIL,ZESTRIL) 20 MG tablet Take 20 mg by mouth daily.   08/17/2017 at Unknown time  . metFORMIN (GLUCOPHAGE) 500 MG tablet Take by mouth 2 (two) times daily with a meal.   08/17/2017 at Unknown time  . azithromycin (ZITHROMAX Z-PAK) 250 MG tablet Take 2 tablets (500 mg) on  Day 1,  followed by 1 tablet (250 mg)  once daily on Days 2 through 5. (Patient not taking: Reported on 08/18/2017) 6 each 0 Completed Course at Unknown time  . benzonatate (TESSALON PERLES) 100 MG capsule Take 1 capsule (100 mg total) by mouth 3 (three) times daily as needed for cough. (Patient not taking: Reported on 08/18/2017) 30 capsule 0 Completed Course at Unknown time  . predniSONE (DELTASONE) 10 MG tablet Take 6 tablets on day 1, take 5 tablets on day 2, take 4 tablets on day 3, take 3 tablets on day 4, take 2 tablets on day 5, take 1 tablet on day 6 (Patient not taking: Reported on 08/18/2017) 21 tablet 0 Completed Course at Unknown time     Allergies  Allergen Reactions  . Acetaminophen   . Morphine And Related      Past Medical History:  Diagnosis Date  . Diabetes mellitus without complication (HCC)   . Gout   . Hypercholesteremia   . Hypertension     Review of systems:      Physical Exam    Heart and lungs: Regular rate and rhythm without rub or gallop, lungs are bilaterally clear.    HEENT: Normocephalic atraumatic eyes are anicteric    Other:    Pertinant exam for procedure: Soft nontender nondistended bowel sounds positive normoactive.    Planned proceedures: EGD and indicated procedures. I have discussed the risks benefits and complications of procedures to include not limited to bleeding, infection, perforation and the risk of sedation and the  patient wishes to proceed.    Andrew Deem, [facility] 08/18/2017  10:11 AM

## 2017-08-18 NOTE — Anesthesia Preprocedure Evaluation (Signed)
Anesthesia Evaluation  Patient identified by MRN, date of birth, ID band Patient awake    Reviewed: Allergy & Precautions, H&P , NPO status , Patient's Chart, lab work & pertinent test results, reviewed documented beta blocker date and time   Airway Mallampati: II   Neck ROM: full    Dental  (+) Poor Dentition   Pulmonary neg pulmonary ROS, former smoker,    Pulmonary exam normal        Cardiovascular Exercise Tolerance: Good hypertension, On Medications negative cardio ROS Normal cardiovascular exam Rhythm:regular Rate:Normal     Neuro/Psych negative neurological ROS  negative psych ROS   GI/Hepatic negative GI ROS, Neg liver ROS,   Endo/Other  negative endocrine ROSdiabetes, Well Controlled, Type 2, Oral Hypoglycemic Agents  Renal/GU negative Renal ROS  negative genitourinary   Musculoskeletal   Abdominal   Peds  Hematology negative hematology ROS (+)   Anesthesia Other Findings Past Medical History: No date: Diabetes mellitus without complication (HCC) No date: Gout No date: Hypercholesteremia No date: Hypertension Past Surgical History: No date: CHOLECYSTECTOMY No date: HERNIA REPAIR   Reproductive/Obstetrics negative OB ROS                             Anesthesia Physical Anesthesia Plan  ASA: II  Anesthesia Plan: General   Post-op Pain Management:    Induction:   PONV Risk Score and Plan:   Airway Management Planned:   Additional Equipment:   Intra-op Plan:   Post-operative Plan:   Informed Consent: I have reviewed the patients History and Physical, chart, labs and discussed the procedure including the risks, benefits and alternatives for the proposed anesthesia with the patient or authorized representative who has indicated his/her understanding and acceptance.   Dental Advisory Given  Plan Discussed with: CRNA  Anesthesia Plan Comments:          Anesthesia Quick Evaluation

## 2017-08-18 NOTE — Op Note (Signed)
[facility] Gastroenterology Patient Name: Andrew Cantrell Procedure Date: 08/18/2017 10:04 AM MRN: [id number] Account #: [id number] Date of Birth: Mar 20, 1950 Admit Type: Outpatient Age: 67 Room: [facility] ENDO ROOM 3 Gender: Male Note Status: Finalized Procedure:            Upper GI endoscopy Indications:          Dysphagia Providers:            Christena Deem, MD Referring MD:         Leanna Sato, MD (Referring MD) Medicines:            Monitored Anesthesia Care Complications:        No immediate complications. Procedure:            Pre-Anesthesia Assessment:                       - ASA Grade Assessment: II - A patient with mild                        systemic disease.                       After obtaining informed consent, the endoscope was                        passed under direct vision. Throughout the procedure,                        the patient's blood pressure, pulse, and oxygen                        saturations were monitored continuously. The Endoscope                        was introduced through the mouth, and advanced to the                        third part of duodenum. The upper GI endoscopy was                        accomplished without difficulty. The patient tolerated                        the procedure well. Findings:      The lumen of the upper third of the esophagus, middle third of the       esophagus and lower third of the esophagus was moderately dilated.      No appreciable esophageal motility was noted.A small amount of food       debris is noted in the mid and distal esophagus, easily removed with       suction. In addition, a gaping lower esophageal sphincter was found.       There was moderate resistance to endoscope advancement into the stomach,       relaxed to allow passage into stomach. The Z-line was regular, biopsy       taken for histology. The gastroesophageal junction and cardia were       normal on retroflexed  view. No other atypical lesion noted.      Diffuse and patchy mild inflammation characterized by congestion       (edema), erosions and  erythema was found in the gastric body and in the       gastric antrum. Biopsies were taken with a cold forceps for histology.       Biopsies were taken with a cold forceps for Helicobacter pylori testing.      A single 3 mm mucosal nodule with a localized distribution was found in       the third portion of the duodenum, impression of possible cholesterol       plaque. Biopsies were taken with a cold forceps for histology.      The exam of the duodenum was otherwise normal.      Erythema of the aryetnoid folds noted. Impression:           - Dilation in the upper third of the esophagus, in the                        middle third of the esophagus and in the lower third of                        the esophagus.                       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                       - Erosive gastritis. Biopsied.                       - Mucosal nodule found in the duodenum. Biopsied. Recommendation:       - Refer to a gastroenterologist at appointment to be                        scheduled. Procedure Code(s):    --- Professional ---                       [phone], Esophagogastroduodenoscopy, flexible, transoral;                        with biopsy, single or multiple Diagnosis Code(s):    --- Professional ---                       K22.8, Other specified diseases of esophagus                       K29.60, Other gastritis without bleeding                       K31.89, Other diseases of stomach and duodenum                       R13.10, Dysphagia, unspecified CPT copyright 2017 [employer] Association. All rights reserved. The codes documented in this report are preliminary and upon coder review may  be revised to meet current compliance requirements. Christena DeemMartin U Skulskie, MD 08/18/2017 10:55:03 AM This  report has been signed electronically. Number of Addenda: 0 Note Initiated On: 08/18/2017 10:04 AM      [facility]

## 2017-08-18 NOTE — Transfer of Care (Signed)
Immediate Anesthesia Transfer of Care Note  Patient: Andrew Cantrell  Procedure(s) Performed: ESOPHAGOGASTRODUODENOSCOPY (EGD) WITH PROPOFOL (N/A )  Patient Location: PACU and Endoscopy Unit  Anesthesia Type:General  Level of Consciousness: sedated  Airway & Oxygen Therapy: Patient Spontanous Breathing and Patient connected to nasal cannula oxygen  Post-op Assessment: Report given to RN and Post -op Vital signs reviewed and stable  Post vital signs: Reviewed and stable  Last Vitals:  Vitals Value Taken Time  BP    Temp    Pulse 85 08/18/2017 10:48 AM  Resp 13 08/18/2017 10:48 AM  SpO2 98 % 08/18/2017 10:48 AM  Vitals shown include unvalidated device data.  Last Pain:  Vitals:   08/18/17 0819  TempSrc: Tympanic  PainSc: 0-No pain         Complications: No apparent anesthesia complications

## 2017-08-18 NOTE — Anesthesia Post-op Follow-up Note (Signed)
Anesthesia QCDR form completed.        

## 2017-08-19 ENCOUNTER — Encounter: Payer: Self-pay | Admitting: Gastroenterology

## 2017-08-20 LAB — SURGICAL PATHOLOGY

## 2017-08-23 NOTE — Anesthesia Postprocedure Evaluation (Signed)
Anesthesia Post Note  Patient: Andrew SchneidersChristopher G Cieslak  Procedure(s) Performed: ESOPHAGOGASTRODUODENOSCOPY (EGD) WITH PROPOFOL (N/A )  Anesthesia Type: General     Last Vitals:  Vitals:   08/18/17 1120 08/18/17 1130  BP: 113/71 114/74  Pulse: 66 69  Resp: 12 18  Temp:    SpO2: 99% 99%    Last Pain:  Vitals:   08/19/17 0754  TempSrc:   PainSc: 0-No pain                 Yevette EdwardsJames G Adams

## 2017-11-01 ENCOUNTER — Emergency Department
Admission: EM | Admit: 2017-11-01 | Discharge: 2017-11-01 | Disposition: A | Discharge status: Home / Self Care | Payer: Medicare HMO | Attending: Emergency Medicine | Admitting: Emergency Medicine

## 2017-11-01 ENCOUNTER — Encounter: Payer: Self-pay | Admitting: Emergency Medicine

## 2017-11-01 ENCOUNTER — Other Ambulatory Visit: Payer: Self-pay

## 2017-11-01 DIAGNOSIS — Z87891 Personal history of nicotine dependence: Secondary | ICD-10-CM | POA: Insufficient documentation

## 2017-11-01 DIAGNOSIS — K859 Acute pancreatitis without necrosis or infection, unspecified: Secondary | ICD-10-CM | POA: Insufficient documentation

## 2017-11-01 DIAGNOSIS — Z79899 Other long term (current) drug therapy: Secondary | ICD-10-CM | POA: Insufficient documentation

## 2017-11-01 DIAGNOSIS — Z7984 Long term (current) use of oral hypoglycemic drugs: Secondary | ICD-10-CM | POA: Insufficient documentation

## 2017-11-01 DIAGNOSIS — I10 Essential (primary) hypertension: Secondary | ICD-10-CM | POA: Diagnosis not present

## 2017-11-01 DIAGNOSIS — E7800 Pure hypercholesterolemia, unspecified: Secondary | ICD-10-CM | POA: Insufficient documentation

## 2017-11-01 DIAGNOSIS — Z7982 Long term (current) use of aspirin: Secondary | ICD-10-CM | POA: Diagnosis not present

## 2017-11-01 DIAGNOSIS — E119 Type 2 diabetes mellitus without complications: Secondary | ICD-10-CM | POA: Insufficient documentation

## 2017-11-01 DIAGNOSIS — R1013 Epigastric pain: Secondary | ICD-10-CM | POA: Diagnosis present

## 2017-11-01 HISTORY — DX: Achalasia of cardia: K22.0

## 2017-11-01 LAB — COMPREHENSIVE METABOLIC PANEL
ALBUMIN: 4.4 g/dL (ref 3.5–5.0)
ALK PHOS: 56 U/L (ref 38–126)
ALT: 16 U/L (ref 0–44)
AST: 22 U/L (ref 15–41)
Anion gap: 10 (ref 5–15)
BUN: 16 mg/dL (ref 8–23)
CALCIUM: 9.7 mg/dL (ref 8.9–10.3)
CO2: 29 mmol/L (ref 22–32)
CREATININE: 1.22 mg/dL (ref 0.61–1.24)
Chloride: 103 mmol/L (ref 98–111)
GFR calc Af Amer: >60 mL/min (ref >60)
GFR calc non Af Amer: >60 mL/min (ref >60)
GLUCOSE: 136 mg/dL — AB (ref 70–99)
Potassium: 3.8 mmol/L (ref 3.5–5.1)
SODIUM: 142 mmol/L (ref 135–145)
Total Bilirubin: 0.8 mg/dL (ref 0.3–1.2)
Total Protein: 8.6 g/dL — ABNORMAL HIGH (ref 6.5–8.1)

## 2017-11-01 LAB — CBC
HCT: 43 % (ref 40.0–52.0)
Hemoglobin: 14.4 g/dL (ref 13.0–18.0)
MCH: 31.4 pg (ref 26.0–34.0)
MCHC: 33.6 g/dL (ref 32.0–36.0)
MCV: 93.5 fL (ref 80.0–100.0)
PLATELETS: 225 10*3/uL (ref 150–440)
RBC: 4.6 MIL/uL (ref 4.40–5.90)
RDW: 13.7 % (ref 11.5–14.5)
WBC: 9 10*3/uL (ref 3.8–10.6)

## 2017-11-01 LAB — TROPONIN I

## 2017-11-01 LAB — LIPASE, BLOOD: Lipase: 80 U/L — ABNORMAL HIGH (ref 11–51)

## 2017-11-01 MED ORDER — GI COCKTAIL ~~LOC~~
30.0000 mL | Freq: Once | ORAL | Status: AC
  Administered 2017-11-01: 30 mL via ORAL
  Filled 2017-11-01: qty 30

## 2017-11-01 MED ORDER — OXYCODONE HCL 5 MG PO TABS
10.0000 mg | ORAL_TABLET | Freq: Once | ORAL | Status: AC
  Administered 2017-11-01: 10 mg via ORAL
  Filled 2017-11-01: qty 2

## 2017-11-01 MED ORDER — OXYCODONE HCL 5 MG PO TABS: 5.0000 mg | ORAL_TABLET | Freq: Three times a day (TID) | ORAL | 0 refills | Status: AC | PRN

## 2017-11-01 NOTE — ED Provider Notes (Signed)
[facility] Emergency Department Provider Note  Time seen: 9:14 PM  I have reviewed the triage vital signs and the nursing notes.   HISTORY  Chief Complaint Abdominal Pain and Post-op Problem    HPI Andrew Cantrell is a 67 y.o. male with a past medical history of achalasia, diabetes, hypertension, hyperlipidemia presents to the emergency department for epigastric discomfort and gas sensation.  According to the patient he had an endoscopy performed on Friday for treatment of his known achalasia.  States he ate fairly well yesterday he did vomit after eating dinner last night but states it is fairly normal for him to vomit at times after eating.  He states today he was feeling well he attempted to eat dinner shortly after eating dinner he began vomiting which again he states his fairly normal but then started developing progressively worsening epigastric pain.  States it was a 9/10 earlier however currently it is a 4/10 more of an aching pain which keeps calling "gas pain."  Denies any pain in his chest, trouble breathing.  Denies any current nausea.   Past Medical History:  Diagnosis Date  . Achalasia   . Diabetes mellitus without complication (HCC)   . Gout   . Hypercholesteremia   . Hypertension     There are no active problems to display for this patient.   Past Surgical History:  Procedure Laterality Date  . CHOLECYSTECTOMY    . ESOPHAGOGASTRODUODENOSCOPY (EGD) WITH PROPOFOL N/A 08/18/2017   Procedure: ESOPHAGOGASTRODUODENOSCOPY (EGD) WITH PROPOFOL;  Surgeon: Christena DeemSkulskie, Martin U, MD;  [facility] END[facility]COPY;  Service: Endoscopy;  Laterality: N/A;  . HERNIA REPAIR      Prior to Admission medications   Medication Sig Start Date End Date Taking? Authorizing Provider  aspirin EC 81 MG tablet Take 81 mg by mouth daily.   Yes [provider]  atorvastatin (LIPITOR) 20 MG tablet Take 20 mg by mouth daily.   Yes [provider]   lisinopril (PRINIVIL,ZESTRIL) 20 MG tablet Take 20 mg by mouth daily.   Yes [provider]  metFORMIN (GLUCOPHAGE) 500 MG tablet Take by mouth 2 (two) times daily with a meal.   Yes [provider]    Allergies  Allergen Reactions  . Acetaminophen   . Morphine And Related     History reviewed. No pertinent family history.  Social History Social History   Tobacco Use  . Smoking status: Former Games developermoker  . Smokeless tobacco: Never Used  Substance Use Topics  . Alcohol use: Yes    Comment: sometimes a beer  . Drug use: Never    Review of Systems Constitutional: Negative for fever. Cardiovascular: Negative for chest pain. Respiratory: Negative for shortness of breath. Gastrointestinal: Positive for epigastric pain.  Positive for nausea vomiting earlier. Genitourinary: Negative for urinary compaints Musculoskeletal: Negative for musculoskeletal complaints Skin: Negative for skin complaints  Neurological: Negative for headache All other ROS negative  ____________________________________________   PHYSICAL EXAM:  VITAL SIGNS: ED Triage Vitals  Enc Vitals Group     BP 11/01/17 2024 124/70     Pulse Rate 11/01/17 2024 81     Resp 11/01/17 2024 17     Temp 11/01/17 2024 98.2 F (36.8 C)     Temp Source 11/01/17 2024 Oral     SpO2 11/01/17 2024 97 %     Weight 11/01/17 2022 140 lb (63.5 kg)     Height 11/01/17 2022 5' 3.5" (1.613 m)     Head  Circumference --      Peak Flow --      Pain Score 11/01/17 2022 7     Pain Loc --      Pain Edu? --      Excl. in GC? --    Constitutional: Alert and oriented. Well appearing and in no distress. Eyes: Normal exam ENT   Head: Normocephalic and atraumatic.   Mouth/Throat: Mucous membranes are moist. Cardiovascular: Normal rate, regular rhythm. No murmur Respiratory: Normal respiratory effort without tachypnea nor retractions. Breath sounds are clear  Gastrointestinal: Soft and nontender. No distention.    Musculoskeletal: Nontender with normal range of motion in all extremities.  Neurologic:  Normal speech and language. No gross focal neurologic deficits  Skin:  Skin is warm, dry and intact.  Psychiatric: Mood and affect are normal.  ____________________________________________    EKG  EKG reviewed and interpreted by myself shows sinus bradycardia 50 bpm with a narrow QRS, normal axis, normal intervals, no ST changes.  ____________________________________________   INITIAL IMPRESSION / ASSESSMENT AND PLAN / ED COURSE  Pertinent labs & imaging results that were available during my care of the patient were reviewed by me and considered in my medical decision making (see chart for details).  Patient presents to the emergency department for epigastric pain since this evening states currently 5/10 in severity.  Benign abdominal exam nontender to palpation.  Patient's labs have resulted showing an elevated lipase of 80.   Patient denies any pain in the chest denies any shortness of breath.  Reassuring the patient's troponin is negative EKG is reassuring.  Rest of the patient's work-up is resulted largely within normal limits.  Patient states his pain is decreased quite a bit.  He does admit to drinking alcohol I highly suspect pancreatitis to be the cause of his discomfort.  I discussed with the patient discontinuation of alcohol, clear liquid diet for the next 2 days progressing to a low-fat diet for the next 7 days.  I also discussed using pain medication as needed as written and very strict return precautions.  Patient agreeable to plan of care. ____________________________________________   FINAL CLINICAL IMPRESSION(S) / ED DIAGNOSES  Pancreatitis    Minna Antis, MD 11/01/17 2243

## 2017-11-01 NOTE — ED Triage Notes (Addendum)
Pt reports he had upper endo Friday related to his history of achalasia; says this was the first procedure of 3; pt says for the last few hours he's had epigastric pain with N/V; pt spitting into emesis bag in triage-says if he swallows his saliva it gives him gas; if he swallows liquids it comes right back up; pt says pain is "unbearable"; pain is non radiating, sharp and aching;

## 2017-11-01 NOTE — Discharge Instructions (Addendum)
As we discussed please refrain from alcohol use over the next 7 days.  For the next 48 hours please adhere to a clear liquid diet.  After which she may use a low-fat diet avoiding all fatty, greasy, oily foods for at least the next 7 days.  As we discussed if your discomfort worsens please return to the emergency department for further evaluation otherwise please follow-up with your primary care doctor.

## 2017-11-26 DIAGNOSIS — K220 Achalasia of cardia: Secondary | ICD-10-CM | POA: Insufficient documentation

## 2018-06-25 ENCOUNTER — Ambulatory Visit: Payer: Medicare HMO | Admitting: Urology

## 2018-09-10 ENCOUNTER — Other Ambulatory Visit: Payer: Self-pay

## 2018-09-10 ENCOUNTER — Encounter: Payer: Self-pay | Admitting: Emergency Medicine

## 2018-09-10 ENCOUNTER — Ambulatory Visit: Payer: Self-pay | Admitting: *Deleted

## 2018-09-10 ENCOUNTER — Ambulatory Visit
Admission: EM | Admit: 2018-09-10 | Discharge: 2018-09-10 | Disposition: A | Discharge status: Home / Self Care | Payer: Medicare HMO | Attending: Family Medicine | Admitting: Family Medicine

## 2018-09-10 DIAGNOSIS — X500XXA Overexertion from strenuous movement or load, initial encounter: Secondary | ICD-10-CM

## 2018-09-10 DIAGNOSIS — S46912A Strain of unspecified muscle, fascia and tendon at shoulder and upper arm level, left arm, initial encounter: Secondary | ICD-10-CM

## 2018-09-10 DIAGNOSIS — M79602 Pain in left arm: Secondary | ICD-10-CM | POA: Diagnosis not present

## 2018-09-10 NOTE — ED Triage Notes (Signed)
Patient c/o left arm pain that started last night.  Patient states that he was moving furniture over the past 2 days.  Patient denies chest pain or SOB.

## 2018-09-10 NOTE — Discharge Instructions (Signed)
Rest, ice/heat, over the counter analgesic Follow up as needed if symptoms worsen or are not improving

## 2018-09-10 NOTE — Telephone Encounter (Signed)
Pt called in c/o pain in left armpit going down the inner arm especially when he twists it.   He was moving heavy furniture yesterday.  He has decided to go to the urgent care in [place] since all the doctor's offices are closed for the July 4th holiday.    Reason for Disposition . Caused by overuse from recent vigorous activity (e.g., sports, lifting, physical work)  Answer Assessment - Initial Assessment Questions 1. ONSET: "When did the pain start?"     A couple of days ago.    I helped my daughter move and this morning I'm having pain under my left arm and running down the inner part of my arm.   It starts in my armpit. 2. LOCATION: "Where is the pain located?"     See above 3. PAIN: "How bad is the pain?" (Scale 1-10; or mild, moderate, severe)   - MILD (1-3): doesn't interfere with normal activities   - MODERATE (4-7): interferes with normal activities (e.g., work or school) or awakens from sleep   - SEVERE (8-10): excruciating pain, unable to do any normal activities, unable to hold a cup of water     7 on pain scale 4. WORK OR EXERCISE: "Has there been any recent work or exercise that involved this part of the body?"     Moved my daughter yesterday 5. CAUSE: "What do you think is causing the arm pain?"     The furniture was heavy 6. OTHER SYMPTOMS: "Do you have any other symptoms?" (e.g., neck pain, swelling, rash, fever, numbness, weakness)     No weakness or numbness.   If I twist my arm it hurts. 7. PREGNANCY: "Is there any chance you are pregnant?" "When was your last menstrual period?"     N/A  Protocols used: ARM PAIN-A-AH

## 2018-09-10 NOTE — ED Provider Notes (Signed)
[facility]    CSN: [id number] Arrival date & time: 09/10/18  0844     History   Chief Complaint Chief Complaint  Patient presents with  . Arm Pain    left    HPI Andrew SchneidersChristopher G Beissel Sr. is a 68 y.o. male.   68 yo male with a c/o left arm pain last night and this morning. States left arm felt sore and sometimes intensity increased with certain movements this morning. States pain was 7/10, however states that right now he's not having any pain. Denies any chest pain, shortness of breath, jaw pain/neck pain, fevers, chills, cough. He also states that over this past week, he was helping his daughter move and was lifting heavy furniture.     Arm Pain    Past Medical History:  Diagnosis Date  . Achalasia   . Diabetes mellitus without complication (HCC)   . Gout   . Hypercholesteremia   . Hypertension     There are no active problems to display for this patient.   Past Surgical History:  Procedure Laterality Date  . CHOLECYSTECTOMY    . ESOPHAGOGASTRODUODENOSCOPY (EGD) WITH PROPOFOL N/A 08/18/2017   Procedure: ESOPHAGOGASTRODUODENOSCOPY (EGD) WITH PROPOFOL;  Surgeon: Christena DeemSkulskie, Martin U, MD;  [facility] END[facility]COPY;  Service: Endoscopy;  Laterality: N/A;  . HERNIA REPAIR         Home Medications    Prior to Admission medications   Medication Sig Start Date End Date Taking? Authorizing Provider  aspirin EC 81 MG tablet Take 81 mg by mouth daily.   Yes [provider]  metFORMIN (GLUCOPHAGE) 500 MG tablet Take by mouth 2 (two) times daily with a meal.   Yes [provider]  atorvastatin (LIPITOR) 20 MG tablet Take 20 mg by mouth daily.    [provider]  lisinopril (PRINIVIL,ZESTRIL) 20 MG tablet Take 20 mg by mouth daily.    [provider]  oxyCODONE (ROXICODONE) 5 MG immediate release tablet Take 1 tablet (5 mg total) by mouth every 8 (eight) hours as needed. 11/01/17 11/01/18  Andrew Cantrell, Kevin, MD    Family  History History reviewed. No pertinent family history.  Social History Social History   Tobacco Use  . Smoking status: Former Games developermoker  . Smokeless tobacco: Never Used  Substance Use Topics  . Alcohol use: Yes    Comment: sometimes a beer  . Drug use: Never     Allergies   Acetaminophen and Morphine and related   Review of Systems Review of Systems   Physical Exam Triage Vital Signs ED Triage Vitals  Enc Vitals Group     BP 09/10/18 0856 108/79     Pulse Rate 09/10/18 0856 73     Resp 09/10/18 0856 16     Temp 09/10/18 0856 98.5 F (36.9 C)     Temp Source 09/10/18 0856 Oral     SpO2 09/10/18 0856 98 %     Weight 09/10/18 0851 155 lb (70.3 kg)     Height 09/10/18 0851 5' 3.5" (1.613 m)     Head Circumference --      Peak Flow --      Pain Score 09/10/18 0851 0     Pain Loc --      Pain Edu? --      Excl. in GC? --    No data found.  Updated Vital Signs BP 108/79 (BP Location: Left Arm)   Pulse 73   Temp 98.5 F (36.9 C) (  Oral)   Resp 16   Ht 5' 3.5" (1.613 m)   Wt 70.3 kg   SpO2 98%   BMI 27.03 kg/m   Visual Acuity Right Eye Distance:   Left Eye Distance:   Bilateral Distance:    Right Eye Near:   Left Eye Near:    Bilateral Near:     Physical Exam Vitals signs and nursing note reviewed.  Constitutional:      General: He is not in acute distress.    Appearance: He is not toxic-appearing or diaphoretic.  Neck:     Musculoskeletal: Normal range of motion and neck supple.  Cardiovascular:     Rate and Rhythm: Normal rate and regular rhythm.     Pulses: Normal pulses.     Heart sounds: Normal heart sounds.  Pulmonary:     Effort: Pulmonary effort is normal. No respiratory distress.     Breath sounds: Normal breath sounds. No stridor. No wheezing, rhonchi or rales.  Musculoskeletal: Normal range of motion.        General: No swelling, tenderness or deformity.     Right lower leg: No edema.     Left lower leg: No edema.  Neurological:      General: No focal deficit present.     Mental Status: He is alert.      UC Treatments / Results  Labs (all labs ordered are listed, but only abnormal results are displayed) Labs Reviewed - No data to display  EKG   Radiology No results found.  Procedures ED EKG  Date/Time: 09/10/2018 9:34 AM Performed by: Norval Gable, MD Authorized by: Norval Gable, MD   ECG reviewed by ED Physician in the absence of a cardiologist: yes   Previous ECG:    Previous ECG:  Compared to current   Similarity:  No change Interpretation:    Interpretation: normal   Rate:    ECG rate:  65   ECG rate assessment: normal   Rhythm:    Rhythm: sinus rhythm   Ectopy:    Ectopy: none   QRS:    QRS axis:  Normal Conduction:    Conduction: normal   ST segments:    ST segments:  Normal T waves:    T waves: normal     (including critical care time)  Medications Ordered in UC Medications - No data to display  Initial Impression / Assessment and Plan / UC Course  I have reviewed the triage vital signs and the nursing notes.  Pertinent labs & imaging results that were available during my care of the patient were reviewed by me and considered in my medical decision making (see chart for details).      Final Clinical Impressions(s) / UC Diagnoses   Final diagnoses:  Muscle strain of left upper arm, initial encounter    ED Prescriptions    None      1. ekg result (normal) and diagnosis reviewed with patient 2. Recommend supportive treatment with rest, ice/heat, otc analgesics prn 3. Follow-up prn if symptoms worsen or don't improve   Controlled Substance Prescriptions [place] Controlled Substance Registry consulted? Not Applicable   Norval Gable, MD 09/10/18 [phone]

## 2018-09-21 ENCOUNTER — Other Ambulatory Visit: Payer: Self-pay

## 2018-09-21 ENCOUNTER — Other Ambulatory Visit: Payer: Self-pay | Admitting: *Deleted

## 2018-09-21 DIAGNOSIS — R972 Elevated prostate specific antigen [PSA]: Secondary | ICD-10-CM

## 2018-09-24 ENCOUNTER — Ambulatory Visit: Payer: Medicare HMO | Admitting: Urology

## 2018-11-05 ENCOUNTER — Encounter: Payer: Self-pay | Admitting: Urology

## 2018-11-05 ENCOUNTER — Other Ambulatory Visit
Admission: RE | Admit: 2018-11-05 | Discharge: 2018-11-05 | Disposition: A | Discharge status: Home / Self Care | Payer: Medicare HMO | Attending: Urology | Admitting: Urology

## 2018-11-05 ENCOUNTER — Other Ambulatory Visit: Payer: Self-pay

## 2018-11-05 ENCOUNTER — Ambulatory Visit (INDEPENDENT_AMBULATORY_CARE_PROVIDER_SITE_OTHER): Payer: Medicare HMO | Admitting: Urology

## 2018-11-05 VITALS — BP 119/76 | HR 81 | Ht 63.0 in | Wt 140.0 lb

## 2018-11-05 DIAGNOSIS — N401 Enlarged prostate with lower urinary tract symptoms: Secondary | ICD-10-CM

## 2018-11-05 DIAGNOSIS — R972 Elevated prostate specific antigen [PSA]: Secondary | ICD-10-CM | POA: Diagnosis not present

## 2018-11-05 DIAGNOSIS — R3911 Hesitancy of micturition: Secondary | ICD-10-CM | POA: Diagnosis not present

## 2018-11-05 LAB — URINALYSIS, COMPLETE (UACMP) WITH MICROSCOPIC
Bacteria, UA: NONE SEEN
Bilirubin Urine: NEGATIVE
Glucose, UA: 100 mg/dL — AB
Hgb urine dipstick: NEGATIVE
Ketones, ur: NEGATIVE mg/dL
Leukocytes,Ua: NEGATIVE
Nitrite: NEGATIVE
Protein, ur: 30 mg/dL — AB
RBC / HPF: NONE SEEN RBC/hpf (ref 0–5)
Specific Gravity, Urine: 1.025 (ref 1.005–1.030)
pH: 5 (ref 5.0–8.0)

## 2018-11-05 LAB — PSA: Prostatic Specific Antigen: 4.99 ng/mL — ABNORMAL HIGH (ref 0.00–4.00)

## 2018-11-05 NOTE — Progress Notes (Signed)
11/05/2018 9:03 AM   Andrew Schneidershristopher G Tanabe Sr. August 24, 1950 [id number]  Referring provider: Leanna SatoMiles, Linda M, MD [address] [place] [id number]  Chief Complaint  Patient presents with  . Elevated PSA    New Patient    HPI: 68 year old male referred for further evaluation of elevated PSA.  [facility] routine annual PSA screening.  At that time, his PSA was 5.1 on May 21, 2018.  This is not been repeated.  We have no previous PSAs for comparison.  No family history of prostate cancer.  He does have some mild urinary hesitancy which is been going on for years.  Gets up once or twice at night to void depending on what he drinks before he goes to bed.  He is not bothered by this.  He has a good stream.  He feels like he empties.  No dysuria or gross hematuria.  No UTIs.  He does have personal history of diabetes, erectile dysfunction.  PMH: Past Medical History:  Diagnosis Date  . Achalasia   . Diabetes mellitus without complication (HCC)   . Gout   . Hypercholesteremia   . Hypertension     Surgical History: Past Surgical History:  Procedure Laterality Date  . CHOLECYSTECTOMY    . ESOPHAGOGASTRODUODENOSCOPY (EGD) WITH PROPOFOL N/A 08/18/2017   Procedure: ESOPHAGOGASTRODUODENOSCOPY (EGD) WITH PROPOFOL;  Surgeon: Christena DeemSkulskie, Martin U, MD;  [facility];  Service: Endoscopy;  Laterality: N/A;  . HERNIA REPAIR      Home Medications:  Allergies as of 11/05/2018      Reactions   Acetaminophen    Morphine And Related       Medication List       Accurate as of November 05, 2018  9:03 AM. If you have any questions, ask your nurse or doctor.        aspirin EC 81 MG tablet Take 81 mg by mouth daily.   atorvastatin 20 MG tablet Commonly known as: LIPITOR Take 20 mg by mouth daily.   lisinopril 20 MG tablet Commonly known as: ZESTRIL Take 20 mg by mouth daily.   metFORMIN 500 MG tablet Commonly  known as: GLUCOPHAGE Take by mouth 2 (two) times daily with a meal.       Allergies:  Allergies  Allergen Reactions  . Acetaminophen   . Morphine And Related     Family History: No family history on file.  Social History:  reports that he has quit smoking. He has never used smokeless tobacco. He reports current alcohol use. He reports that he does not use drugs.  ROS: UROLOGY Frequent Urination?: Yes Hard to postpone urination?: No Burning/pain with urination?: No Get up at night to urinate?: Yes Leakage of urine?: No Urine stream starts and stops?: No Trouble starting stream?: Yes Do you have to strain to urinate?: No Blood in urine?: No Urinary tract infection?: No Sexually transmitted disease?: No Injury to kidneys or bladder?: No Painful intercourse?: No Weak stream?: No Erection problems?: Yes Penile pain?: No  Gastrointestinal Nausea?: No Vomiting?: No Indigestion/heartburn?: No Diarrhea?: No Constipation?: No  Constitutional Fever: No Night sweats?: No Weight loss?: No Fatigue?: No  Skin Skin rash/lesions?: No Itching?: No  Eyes Blurred vision?: No Double vision?: No  Ears/Nose/Throat Sore throat?: No Sinus problems?: No  Hematologic/Lymphatic Swollen glands?: No Easy bruising?: No  Cardiovascular Leg swelling?: No Chest pain?: No  Respiratory Cough?: No Shortness of breath?: No  Endocrine  Excessive thirst?: No  Musculoskeletal Back pain?: No Joint pain?: No  Neurological Headaches?: No Dizziness?: No  Psychologic Depression?: No Anxiety?: No  Physical Exam: BP 119/76   Pulse 81   Ht 5\' 3"  (1.6 m)   Wt 140 lb (63.5 kg)   BMI 24.80 kg/m   Constitutional:  Alert and oriented, No acute distress. HEENT: [place] AT, moist mucus membranes.  Trachea midline, no masses. Cardiovascular: No clubbing, cyanosis, or edema. Respiratory: Normal respiratory effort, no increased work of breathing. GI: Abdomen is soft, nontender,  nondistended, no abdominal masses GU: No CVA tenderness Rectal: Normal sphincter tone.  Enlarged prostate, 40+ cc, nontender no nodules. Skin: No rashes, bruises or suspicious lesions. Neurologic: Grossly intact, no focal deficits, moving all 4 extremities. Psychiatric: Normal mood and affect.  Laboratory Data: Lab Results  Component Value Date   WBC 9.0 11/01/2017   HGB 14.4 11/01/2017   HCT 43.0 11/01/2017   MCV 93.5 11/01/2017   PLT 225 11/01/2017    Lab Results  Component Value Date   CREATININE 1.22 11/01/2017    Pertinent Imaging: n/a  Assessment & Plan:    1. Elevated PSA  We reviewed the implications of an elevated PSA and the uncertainty surrounding it. In general, a man's PSA increases with age and is produced by both normal and cancerous prostate tissue. Differential for elevated PSA is BPH, prostate cancer, infection, recent intercourse/ejaculation, prostate infarction, recent urethroscopic manipulation (foley placement/cystoscopy) and prostatitis. Management of an elevated PSA can include observation or prostate biopsy and wediscussed this in detail. We discussed that indications for prostate biopsy are defined by age and race specific PSA cutoffs as well as a PSA velocity of 0.75/year.  Given that we have no previous values, I recommended repeating it today.  Depending on these results, will likely either follow him closely or consider prostate biopsy.  This was discussed at length.  He is agreeable this plan.  Will call with results and plan.  -PSA -UA  2. Benign prostatic hyperplasia with urinary hesitancy Mild BPH symptoms with minimal bother   Hollice Espy, MD  [facility] [address] [place], [place] [address] [phone]

## 2018-11-08 ENCOUNTER — Telehealth: Payer: Self-pay

## 2018-11-08 DIAGNOSIS — R972 Elevated prostate specific antigen [PSA]: Secondary | ICD-10-CM

## 2018-11-08 NOTE — Telephone Encounter (Signed)
Called pt informed him of the information below. Pt states that he would like to proceed with rechecking PSA. PSA ordered. Lab appt scheduled

## 2018-11-08 NOTE — Telephone Encounter (Signed)
-----   Message from Hollice Espy, MD sent at 11/08/2018  3:36 PM EDT ----- Your PSA is essentially stable.  Given that we have no history on you, it is difficult to interpret this, unclear if it is rising which would be concerning or if this is your baseline value.  We have 2 options, either see back about 4 months with a PSA prior and reevaluate.  The alternative to that is just going had pursuing biopsy.  I will leave this up to you.    Staff- Please make the appropriate arrangements based on his response.  Hollice Espy, MD

## 2019-02-14 ENCOUNTER — Other Ambulatory Visit
Admission: RE | Admit: 2019-02-14 | Discharge: 2019-02-14 | Disposition: A | Discharge status: Home / Self Care | Payer: Medicare HMO | Attending: Urology | Admitting: Urology

## 2019-02-14 ENCOUNTER — Other Ambulatory Visit: Payer: Self-pay

## 2019-02-14 ENCOUNTER — Encounter: Payer: Self-pay | Admitting: Urology

## 2019-02-14 DIAGNOSIS — R972 Elevated prostate specific antigen [PSA]: Secondary | ICD-10-CM | POA: Diagnosis present

## 2019-02-14 LAB — PSA: Prostatic Specific Antigen: 3.45 ng/mL (ref 0.00–4.00)

## 2019-02-16 ENCOUNTER — Telehealth: Payer: Self-pay | Admitting: *Deleted

## 2019-02-16 DIAGNOSIS — R972 Elevated prostate specific antigen [PSA]: Secondary | ICD-10-CM

## 2019-02-16 NOTE — Telephone Encounter (Addendum)
Left Vm to return call  ----- Message from Hollice Espy, MD sent at 02/16/2019  8:34 AM EST ----- PSA is trending back down and now within the normal range.  I like to see you next year with a PSA prior to keep a very close eye on this.  Please arrange.  Hollice Espy, MD

## 2019-02-17 NOTE — Telephone Encounter (Signed)
Pt LMOM returning your call. °

## 2019-02-18 NOTE — Telephone Encounter (Addendum)
Patient returned call-scheduled lab visit for PSA and follow up appointment with Dr. Erlene Quan. Patient voiced understanding.

## 2020-02-20 ENCOUNTER — Other Ambulatory Visit: Payer: Self-pay

## 2020-02-22 ENCOUNTER — Ambulatory Visit: Payer: Medicare HMO | Admitting: Urology

## 2020-02-22 ENCOUNTER — Other Ambulatory Visit: Payer: Self-pay

## 2020-02-22 ENCOUNTER — Encounter: Payer: Self-pay | Admitting: Urology

## 2020-02-22 VITALS — BP 118/80 | HR 71

## 2020-02-22 DIAGNOSIS — R972 Elevated prostate specific antigen [PSA]: Secondary | ICD-10-CM

## 2020-02-22 DIAGNOSIS — R350 Frequency of micturition: Secondary | ICD-10-CM

## 2020-02-22 NOTE — Progress Notes (Signed)
02/22/2020 10:44 AM   Andrew Schneiders Sr. 1950-05-11 [id number]  Referring provider: Leanna Sato, MD [address] [place],  [place] [address]  Chief Complaint  Patient presents with  . Elevated PSA    HPI: 69 year old male with a personal history of elevated PSA who returns today for annual follow-up.  He was seen last year with a rise in his PSA up to 5.1 in 05/2018.    He elected no further intervention his PSA subsequently trended back down to normal.  His most recent PSA was 3.45 on 02/2019.  He returns today to clinic for repeat PSA/DRE.  He has not had his PSA drawn prior to this appointment.  No new medical issues this year.  He continues to have some occasional urinary frequency and nocturia 1-3 times depending on his oral intake.  He feels like he empties.  No dysuria or gross hematuria.  Is not bothered by his urinary symptoms.  He is on no BPH medications.  No weight loss or bone pain.   PMH: Past Medical History:  Diagnosis Date  . Achalasia   . Diabetes mellitus without complication (HCC)   . Gout   . Hypercholesteremia   . Hypertension     Surgical History: Past Surgical History:  Procedure Laterality Date  . CHOLECYSTECTOMY    . ESOPHAGOGASTRODUODENOSCOPY (EGD) WITH PROPOFOL N/A 08/18/2017   Procedure: ESOPHAGOGASTRODUODENOSCOPY (EGD) WITH PROPOFOL;  Surgeon: Christena Deem, MD;  Location: [facility] ENDOSCOPY;  Service: Endoscopy;  Laterality: N/A;  . HERNIA REPAIR      Home Medications:  Allergies as of 02/22/2020      Reactions   Acetaminophen    Morphine And Related       Medication List       Accurate as of February 22, 2020 10:44 AM. If you have any questions, ask your nurse or doctor.        aspirin EC 81 MG tablet Take 81 mg by mouth daily.   atorvastatin 20 MG tablet Commonly known as: LIPITOR Take 20 mg by mouth daily.   lisinopril 20 MG tablet Commonly known as: ZESTRIL Take 20 mg by mouth daily.    lovastatin 40 MG tablet Commonly known as: MEVACOR   metFORMIN 500 MG tablet Commonly known as: GLUCOPHAGE Take by mouth 2 (two) times daily with a meal.       Allergies:  Allergies  Allergen Reactions  . Acetaminophen   . Morphine And Related     Family History: No family history on file.  Social History:  reports that he has quit smoking. He has never used smokeless tobacco. He reports current alcohol use. He reports that he does not use drugs.   Physical Exam: BP 118/80   Pulse 71   Constitutional:  Alert and oriented, No acute distress.  Accompanied by wife today. HEENT: [place] AT, moist mucus membranes.  Trachea midline, no masses. Cardiovascular: No clubbing, cyanosis, or edema. Respiratory: Normal respiratory effort, no increased work of breathing. Rectal: Normal sphincter tone.  Mildly enlarged prostate, nontender no nodules. Skin: No rashes, bruises or suspicious lesions. Neurologic: Grossly intact, no focal deficits, moving all 4 extremities. Psychiatric: Normal mood and affect.   Assessment & Plan:    1. Elevated PSA Personal history of elevated PSA which is trending back down Rectal exam today is reassuring, PSA is pending  Depending on this value, he may be able to be discharged back to his primary care physician versus continued follow-up in  this clinic.  He is agreeable this plan.  Call tomorrow with PSA results and recommendations.  2. Urinary frequency Reviewed behavioral modification Minimal bother   Vanna [place], MD  [facility] [address], [address] [place], [place] [address] [phone]

## 2020-02-22 NOTE — Addendum Note (Signed)
Addended by: Janae Bridgeman on: 02/22/2020 10:55 AM   Modules accepted: Orders

## 2020-02-23 LAB — PSA: Prostate Specific Ag, Serum: 5.3 ng/mL — ABNORMAL HIGH (ref 0.0–4.0)

## 2020-02-24 ENCOUNTER — Telehealth: Payer: Self-pay | Admitting: *Deleted

## 2020-02-24 NOTE — Telephone Encounter (Addendum)
Left VM to return call  ----- Message from Vanna [place], MD sent at 02/23/2020 12:14 PM EST ----- He has a is back up again where it was last year.  Lets have him come back in 6 months with a PSA prior to continue to trend closely.  Vanna Scotland, MD

## 2020-02-27 NOTE — Telephone Encounter (Signed)
Pt calls triage line and states he missed a call for results. Informed pt of PSA results per Dr. Apolinar Junes, pt gave verbal understanding.

## 2020-06-26 ENCOUNTER — Telehealth: Payer: Self-pay | Admitting: Urology

## 2020-06-26 NOTE — Telephone Encounter (Signed)
Patient's PCP left a voicemail today stating that the patient called their office asking them to contact us asking [place] to call him to let him know when his follow up appts were. I tried calling no voicemail. I mailed the appts to the patient since they were not until December.  Andrew Cantrell

## 2020-06-28 ENCOUNTER — Encounter: Payer: Self-pay | Admitting: Gastroenterology

## 2020-06-29 ENCOUNTER — Other Ambulatory Visit: Payer: Self-pay

## 2020-06-29 ENCOUNTER — Encounter: Payer: Self-pay | Admitting: *Deleted

## 2020-07-31 ENCOUNTER — Other Ambulatory Visit: Payer: Self-pay

## 2020-08-02 ENCOUNTER — Ambulatory Visit: Payer: Medicare HMO | Admitting: Gastroenterology

## 2020-10-26 ENCOUNTER — Other Ambulatory Visit: Payer: Self-pay

## 2020-10-26 ENCOUNTER — Encounter: Payer: Self-pay | Admitting: Gastroenterology

## 2020-10-26 ENCOUNTER — Ambulatory Visit (INDEPENDENT_AMBULATORY_CARE_PROVIDER_SITE_OTHER): Payer: Medicare HMO | Admitting: Gastroenterology

## 2020-10-26 VITALS — BP 110/74 | HR 76 | Temp 97.8°F | Ht 63.0 in | Wt 153.4 lb

## 2020-10-26 DIAGNOSIS — K921 Melena: Secondary | ICD-10-CM | POA: Diagnosis not present

## 2020-10-26 MED ORDER — SUTAB 1479-225-188 MG PO TABS
12.0000 | ORAL_TABLET | Freq: Two times a day (BID) | ORAL | 0 refills | Status: DC
Start: 2020-10-26 — End: 2020-11-22

## 2020-10-26 NOTE — Progress Notes (Signed)
Arlyss Repress, MD [address]  [address]  [place], [place] [address]  Main: [phone]  Fax: [phone]    Gastroenterology Consultation  Referring Provider:     Leanna Sato, MD Primary Care Physician:  Leanna Sato, MD Primary Gastroenterologist:  Dr. Arlyss Repress Reason for Consultation:     Hematochezia        HPI:   Andrew Cantrell Sr. is a 70 y.o. male referred by Dr. Marvis Moeller, Doralee Albino, MD  for consultation & management of hematochezia.  Patient reports that for last few months, he has been experiencing intermittent blood in the stool, anywhere from bright red to dark red in color, by itself or mixed with stool.  Patient denies any rectal pain, abdominal pain, nausea or vomiting.  He denies abdominal bloating.  His hemoglobin is normal.  He reports having had a colonoscopy several years ago, he had hernia repair with fundoplication in the past.  He had upper endoscopy in 2019  Patient denies smoking or alcohol use Patient denies family history of colon cancer NSAIDs: None  Antiplts/Anticoagulants/Anti thrombotics: None  GI Procedures:  Colonoscopy 11/06/2012 Diagnosis:  Part A: RIGHT COLON RANDOM COLD BIOPSY:  - NEGATIVE FOR COLITIS.  Marland Kitchen  Part B: LEFT COLON RANDOM COLD BIOPSY:  - NEGATIVE FOR COLITIS.  EGD 08/18/2017 DIAGNOSIS:  A.  STOMACH, ANTRUM AND BODY; COLD BIOPSY:  - MODERATE CHRONIC INACTIVE GASTRITIS.  - NEGATIVE FOR INTESTINAL METAPLASIA, DYSPLASIA, AND MALIGNANCY.  - NEGATIVE FOR H. PYLORI BY IMMUNOHISTOCHEMISTRY.   Comment:  Treated H. pylori gastritis could have this histologic appearance, but  autoimmune gastritis is not excluded.  Clinical correlation is  recommended.   B.  DUODENUM, SMALL MUCOSAL PLAQUE OF THE THIRD PORTION; COLD BIOPSY:  - DILATED LACTEALS.  - NEGATIVE FOR INFECTIOUS AGENTS, DYSPLASIA, AND MALIGNANCY.   Comment:  As a focal isolated finding, dilated lacteals are of no clinical  significance. Correlation  with history of obstruction, radiation, etc.  is recommended.   C.  GASTROESOPHAGEAL JUNCTION; COLD BIOPSY:  - SQUAMOCOLUMNAR MUCOSA WITH MILD CHRONIC INFLAMMATION.  - NEGATIVE FOR GOBLET CELLS, DYSPLASIA, AND MALIGNANCY.  Past Medical History:  Diagnosis Date   Achalasia    Diabetes mellitus without complication (HCC)    Gout    Hypercholesteremia    Hypertension     Past Surgical History:  Procedure Laterality Date   CHOLECYSTECTOMY     ESOPHAGOGASTRODUODENOSCOPY (EGD) WITH PROPOFOL N/A 08/18/2017   Procedure: ESOPHAGOGASTRODUODENOSCOPY (EGD) WITH PROPOFOL;  Surgeon: Christena Deem, MD;  Location: [facility] ENDOSCOPY;  Service: Endoscopy;  Laterality: N/A;   HERNIA REPAIR      Current Outpatient Medications:    ANDROGEL PUMP 20.25 MG/ACT (1.62%) GEL, Apply 2 pump to skin once a day  apply to UE (one pump per arm), Disp: , Rfl:    aspirin EC 81 MG tablet, Take 81 mg by mouth daily., Disp: , Rfl:    atorvastatin (LIPITOR) 20 MG tablet, Take 20 mg by mouth daily., Disp: , Rfl:    JARDIANCE 10 MG TABS tablet, Take 10 mg by mouth daily., Disp: , Rfl:    lisinopril (PRINIVIL,ZESTRIL) 20 MG tablet, Take 20 mg by mouth daily., Disp: , Rfl:    lovastatin (MEVACOR) 40 MG tablet, , Disp: , Rfl:    metFORMIN (GLUCOPHAGE) 500 MG tablet, Take by mouth 2 (two) times daily with a meal., Disp: , Rfl:    Sodium Sulfate-Mag Sulfate-KCl (SUTAB) [phone] MG TABS, Take  12 tablets by mouth in the morning and at bedtime. At 5pm take 12 tablets and then 5 hours prior to colonoscopy take the other 12., Disp: 24 tablet, Rfl: 0       History reviewed. No pertinent family history.   Social History   Tobacco Use   Smoking status: Former   Smokeless tobacco: Never  Building services engineer Use: Never used  Substance Use Topics   Alcohol use: Yes    Comment: sometimes a beer   Drug use: Never    Allergies as of 10/26/2020 - Review Complete 10/26/2020  Allergen Reaction Noted   Acetaminophen   08/17/2017   Morphine and related  06/08/2016    Review of Systems:    All systems reviewed and negative except where noted in HPI.   Physical Exam:  BP 110/74 (BP Location: Right Arm, Patient Position: Sitting)   Pulse 76   Temp 97.8 F (36.6 C) (Oral)   Ht 5\' 3"  (1.6 m)   Wt 153 lb 6.4 oz (69.6 kg)   BMI 27.17 kg/m  No LMP for male patient.  General:   Alert,  Well-developed, well-nourished, pleasant and cooperative in NAD Head:  Normocephalic and atraumatic. Eyes:  Sclera clear, no icterus.   Conjunctiva pink. Ears:  Normal auditory acuity. Nose:  No deformity, discharge, or lesions. Mouth:  No deformity or lesions,oropharynx pink & moist. Neck:  Supple; no masses or thyromegaly. Lungs:  Respirations even and unlabored.  Clear throughout to auscultation.   No wheezes, crackles, or rhonchi. No acute distress. Heart:  Regular rate and rhythm; no murmurs, clicks, rubs, or gallops. Abdomen:  Normal bowel sounds. Soft, non-tender and non-distended without masses, hepatosplenomegaly or hernias noted.  No guarding or rebound tenderness.   Rectal: Not performed Msk:  Symmetrical without gross deformities. Good, equal movement & strength bilaterally. Pulses:  Normal pulses noted. Extremities:  No clubbing or edema.  No cyanosis. Neurologic:  Alert and oriented x3;  grossly normal neurologically. Psych:  Alert and cooperative. Normal mood and affect.  Imaging Studies: Reviewed  Assessment and Plan:   Andrew COTTEN Sr. is a 70 y.o.-year-old pleasant male with history of diabetes, history of cholecystectomy, Nissen's fundoplication for hernia repair is seen in consultation for reported history of painless hematochezia  Recommend upper endoscopy as well as colonoscopy for further evaluation  I have discussed alternative options, risks & benefits,  which include, but are not limited to, bleeding, infection, perforation,respiratory complication & drug reaction.  The patient  agrees with this plan & written consent will be obtained.     Follow up as needed   78, MD

## 2020-11-13 ENCOUNTER — Encounter: Payer: Self-pay | Admitting: Gastroenterology

## 2020-11-19 ENCOUNTER — Other Ambulatory Visit: Payer: Self-pay

## 2020-11-19 MED ORDER — NA SULFATE-K SULFATE-MG SULF 17.5-3.13-1.6 GM/177ML PO SOLN: 354.0000 mL | Freq: Once | ORAL | 0 refills | Status: AC

## 2020-11-19 NOTE — Progress Notes (Signed)
[facility] and left  message stating that the Sutab was to expensive and suprep would be cheaper. Sent over new prescription to the pharmacy

## 2020-11-22 ENCOUNTER — Ambulatory Visit
Admission: RE | Admit: 2020-11-22 | Discharge: 2020-11-22 | Disposition: A | Discharge status: Home / Self Care | Payer: Medicare HMO | Attending: Gastroenterology | Admitting: Gastroenterology

## 2020-11-22 ENCOUNTER — Other Ambulatory Visit: Payer: Self-pay

## 2020-11-22 ENCOUNTER — Ambulatory Visit: Payer: Medicare HMO | Admitting: Anesthesiology

## 2020-11-22 ENCOUNTER — Encounter: Payer: Self-pay | Admitting: Gastroenterology

## 2020-11-22 ENCOUNTER — Encounter
Admission: RE | Disposition: A | Discharge status: Home / Self Care | Payer: Self-pay | Source: Home / Self Care | Attending: Gastroenterology

## 2020-11-22 DIAGNOSIS — K31A11 Gastric intestinal metaplasia without dysplasia, involving the antrum: Secondary | ICD-10-CM | POA: Diagnosis not present

## 2020-11-22 DIAGNOSIS — Z7982 Long term (current) use of aspirin: Secondary | ICD-10-CM | POA: Insufficient documentation

## 2020-11-22 DIAGNOSIS — Z87891 Personal history of nicotine dependence: Secondary | ICD-10-CM | POA: Insufficient documentation

## 2020-11-22 DIAGNOSIS — K2950 Unspecified chronic gastritis without bleeding: Secondary | ICD-10-CM | POA: Diagnosis not present

## 2020-11-22 DIAGNOSIS — Z79899 Other long term (current) drug therapy: Secondary | ICD-10-CM | POA: Insufficient documentation

## 2020-11-22 DIAGNOSIS — K644 Residual hemorrhoidal skin tags: Secondary | ICD-10-CM | POA: Diagnosis not present

## 2020-11-22 DIAGNOSIS — K319 Disease of stomach and duodenum, unspecified: Secondary | ICD-10-CM | POA: Diagnosis not present

## 2020-11-22 DIAGNOSIS — K921 Melena: Secondary | ICD-10-CM | POA: Insufficient documentation

## 2020-11-22 DIAGNOSIS — Z7984 Long term (current) use of oral hypoglycemic drugs: Secondary | ICD-10-CM | POA: Diagnosis not present

## 2020-11-22 DIAGNOSIS — Z9889 Other specified postprocedural states: Secondary | ICD-10-CM | POA: Diagnosis not present

## 2020-11-22 DIAGNOSIS — K31A0 Gastric intestinal metaplasia, unspecified: Secondary | ICD-10-CM

## 2020-11-22 DIAGNOSIS — Z9049 Acquired absence of other specified parts of digestive tract: Secondary | ICD-10-CM | POA: Insufficient documentation

## 2020-11-22 DIAGNOSIS — Z886 Allergy status to analgesic agent status: Secondary | ICD-10-CM | POA: Insufficient documentation

## 2020-11-22 DIAGNOSIS — Z885 Allergy status to narcotic agent status: Secondary | ICD-10-CM | POA: Insufficient documentation

## 2020-11-22 HISTORY — PX: ESOPHAGOGASTRODUODENOSCOPY (EGD) WITH PROPOFOL: SHX5813

## 2020-11-22 HISTORY — DX: Presence of external hearing-aid: Z97.4

## 2020-11-22 HISTORY — PX: COLONOSCOPY WITH PROPOFOL: SHX5780

## 2020-11-22 LAB — GLUCOSE, CAPILLARY
Glucose-Capillary: 120 mg/dL — ABNORMAL HIGH (ref 70–99)
Glucose-Capillary: 109 mg/dL — ABNORMAL HIGH (ref 70–99)

## 2020-11-22 SURGERY — COLONOSCOPY WITH PROPOFOL
Anesthesia: General

## 2020-11-22 MED ORDER — LACTATED RINGERS IV SOLN: INTRAVENOUS | Status: DC

## 2020-11-22 MED ORDER — PROPOFOL 10 MG/ML IV BOLUS
INTRAVENOUS | Status: DC | PRN
  Administered 2020-11-22 (×2): 30 mg via INTRAVENOUS
  Administered 2020-11-22: 120 mg via INTRAVENOUS
  Administered 2020-11-22: 30 mg via INTRAVENOUS
  Administered 2020-11-22: 50 mg via INTRAVENOUS
  Administered 2020-11-22 (×3): 30 mg via INTRAVENOUS

## 2020-11-22 MED ORDER — ACETAMINOPHEN 325 MG PO TABS: 325.0000 mg | ORAL_TABLET | Freq: Once | ORAL | Status: DC

## 2020-11-22 MED ORDER — ACETAMINOPHEN 160 MG/5ML PO SOLN: 325.0000 mg | Freq: Once | ORAL | Status: DC

## 2020-11-22 MED ORDER — SODIUM CHLORIDE 0.9 % IV SOLN: INTRAVENOUS | Status: DC

## 2020-11-22 MED ORDER — GLYCOPYRROLATE 0.2 MG/ML IJ SOLN
INTRAMUSCULAR | Status: DC | PRN
  Administered 2020-11-22: .1 mg via INTRAVENOUS

## 2020-11-22 MED ORDER — LIDOCAINE HCL (CARDIAC) PF 100 MG/5ML IV SOSY
PREFILLED_SYRINGE | INTRAVENOUS | Status: DC | PRN
  Administered 2020-11-22: 30 mg via INTRAVENOUS

## 2020-11-22 SURGICAL SUPPLY — 38 items
BALLN DILATOR 10-12 8 (BALLOONS)
BALLN DILATOR 12-15 8 (BALLOONS)
BALLN DILATOR 15-18 8 (BALLOONS)
BALLN DILATOR CRE 0-12 8 (BALLOONS)
BALLN DILATOR ESOPH 8 10 CRE (MISCELLANEOUS) IMPLANT
BALLOON DILATOR 12-15 8 (BALLOONS) IMPLANT
BALLOON DILATOR 15-18 8 (BALLOONS) IMPLANT
BALLOON DILATOR CRE 0-12 8 (BALLOONS) IMPLANT
BLOCK BITE 60FR ADLT L/F GRN (MISCELLANEOUS) ×2 IMPLANT
CLIP HMST 235XBRD CATH ROT (MISCELLANEOUS) IMPLANT
CLIP RESOLUTION 360 11X235 (MISCELLANEOUS)
ELECT REM PT RETURN 9FT ADLT (ELECTROSURGICAL)
ELECTRODE REM PT RTRN 9FT ADLT (ELECTROSURGICAL) IMPLANT
FCP ESCP3.2XJMB 240X2.8X (MISCELLANEOUS) ×1
FORCEPS BIOP RAD 4 LRG CAP 4 (CUTTING FORCEPS) IMPLANT
FORCEPS BIOP RJ4 240 W/NDL (MISCELLANEOUS) ×2
FORCEPS ESCP3.2XJMB 240X2.8X (MISCELLANEOUS) ×1 IMPLANT
GOWN CVR UNV OPN BCK APRN NK (MISCELLANEOUS) ×2 IMPLANT
GOWN ISOL THUMB LOOP REG UNIV (MISCELLANEOUS) ×4
INJECTOR VARIJECT VIN23 (MISCELLANEOUS) IMPLANT
KIT DEFENDO VALVE AND CONN (KITS) IMPLANT
KIT PRC NS LF DISP ENDO (KITS) ×1 IMPLANT
KIT PROCEDURE OLYMPUS (KITS) ×2
MANIFOLD NEPTUNE II (INSTRUMENTS) ×2 IMPLANT
MARKER SPOT ENDO TATTOO 5ML (MISCELLANEOUS) IMPLANT
PROBE APC STR FIRE (PROBE) IMPLANT
RETRIEVER NET PLAT FOOD (MISCELLANEOUS) IMPLANT
RETRIEVER NET ROTH 2.5X230 LF (MISCELLANEOUS) IMPLANT
SNARE COLD EXACTO (MISCELLANEOUS) IMPLANT
SNARE SHORT THROW 13M SML OVAL (MISCELLANEOUS) IMPLANT
SNARE SHORT THROW 30M LRG OVAL (MISCELLANEOUS) IMPLANT
SNARE SNG USE RND 15MM (INSTRUMENTS) IMPLANT
SPOT EX ENDOSCOPIC TATTOO (MISCELLANEOUS)
SYR INFLATION 60ML (SYRINGE) IMPLANT
TRAP ETRAP POLY (MISCELLANEOUS) IMPLANT
VARIJECT INJECTOR VIN23 (MISCELLANEOUS)
WATER STERILE IRR 250ML POUR (IV SOLUTION) ×2 IMPLANT
WIRE CRE 18-20MM 8CM F G (MISCELLANEOUS) IMPLANT

## 2020-11-22 NOTE — Anesthesia Preprocedure Evaluation (Signed)
Anesthesia Evaluation  Patient identified by MRN, date of birth, ID band Patient awake    Reviewed: Allergy & Precautions, H&P , NPO status , Patient's Chart, lab work & pertinent test results  Airway Mallampati: II  TM Distance: >3 FB Neck ROM: full    Dental no notable dental hx. (+) Missing   Pulmonary former smoker,    Pulmonary exam normal breath sounds clear to auscultation       Cardiovascular hypertension, Normal cardiovascular exam Rhythm:regular Rate:Normal     Neuro/Psych    GI/Hepatic   Endo/Other  diabetes  Renal/GU      Musculoskeletal   Abdominal   Peds  Hematology   Anesthesia Other Findings   Reproductive/Obstetrics                            Anesthesia Physical Anesthesia Plan  ASA: 2  Anesthesia Plan: General   Post-op Pain Management:    Induction: Intravenous  PONV Risk Score and Plan: 2 and Treatment may vary due to age or medical condition, Propofol infusion and TIVA  Airway Management Planned: Natural Airway  Additional Equipment:   Intra-op Plan:   Post-operative Plan:   Informed Consent: I have reviewed the patients History and Physical, chart, labs and discussed the procedure including the risks, benefits and alternatives for the proposed anesthesia with the patient or authorized representative who has indicated his/her understanding and acceptance.     Dental Advisory Given  Plan Discussed with: CRNA  Anesthesia Plan Comments:        Anesthesia Quick Evaluation

## 2020-11-22 NOTE — Transfer of Care (Signed)
Immediate Anesthesia Transfer of Care Note  Patient: Andrew KIMBERLIN Sr.  Procedure(s) Performed: COLONOSCOPY WITH PROPOFOL ESOPHAGOGASTRODUODENOSCOPY (EGD) WITH PROPOFOL  Patient Location: PACU  Anesthesia Type: General  Level of Consciousness: awake, alert  and patient cooperative  Airway and Oxygen Therapy: Patient Spontanous Breathing and Patient connected to supplemental oxygen  Post-op Assessment: Post-op Vital signs reviewed, Patient's Cardiovascular Status Stable, Respiratory Function Stable, Patent Airway and No signs of Nausea or vomiting  Post-op Vital Signs: Reviewed and stable  Complications: No notable events documented.

## 2020-11-22 NOTE — Op Note (Signed)
[facility] Patient Name: Andrew Cantrell Procedure Date: 11/22/2020 10:01 AM MRN: [id number] Account #: [id number] Date of Birth: [birth year] Admit Type: Outpatient Age: 70 Room: [facility] OR ROOM 01 Gender: Male Note Status: Finalized Instrument Name: [id number] Procedure:             Upper GI endoscopy Indications:           Hematochezia Providers:             Toney Reil MD, MD Referring MD:          Leanna Sato, MD (Referring MD) Medicines:             General Anesthesia Complications:         No immediate complications. Estimated blood loss: None. Procedure:             Pre-Anesthesia Assessment:                        - Prior to the procedure, a History and Physical was                         performed, and patient medications and allergies were                         reviewed. The patient is competent. The risks and                         benefits of the procedure and the sedation options and                         risks were discussed with the patient. All questions                         were answered and informed consent was obtained.                         Patient identification and proposed procedure were                         verified by the physician, the nurse, the                         anesthesiologist, the anesthetist and the technician                         in the pre-procedure area in the procedure room in the                         endoscopy suite. Mental Status Examination: alert and                         oriented. Airway Examination: normal oropharyngeal                         airway and neck mobility. Respiratory Examination:                         clear to auscultation. CV Examination: normal.  Prophylactic Antibiotics: The patient does not require                         prophylactic antibiotics. Prior Anticoagulants: The                         patient has taken no previous  anticoagulant or                         antiplatelet agents. ASA Grade Assessment: II - A                         patient with mild systemic disease. After reviewing                         the risks and benefits, the patient was deemed in                         satisfactory condition to undergo the procedure. The                         anesthesia plan was to use general anesthesia.                         Immediately prior to administration of medications,                         the patient was re-assessed for adequacy to receive                         sedatives. The heart rate, respiratory rate, oxygen                         saturations, blood pressure, adequacy of pulmonary                         ventilation, and response to care were monitored                         throughout the procedure. The physical status of the                         patient was re-assessed after the procedure.                        After obtaining informed consent, the endoscope was                         passed under direct vision. Throughout the procedure,                         the patient's blood pressure, pulse, and oxygen                         saturations were monitored continuously. The Endoscope                         was introduced through the mouth, and advanced to the  second part of duodenum. The upper GI endoscopy was                         accomplished without difficulty. The patient tolerated                         the procedure well. Findings:      The duodenal bulb and second portion of the duodenum were normal.      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 The       wrap appeared intact. This was traversed.      Diffuse mildly erythematous mucosa without bleeding was found in the       gastric body. Biopsies were taken with a cold forceps for Helicobacter       pylori testing.      The incisura and gastric antrum were normal.  Biopsies were taken with a       cold forceps for Helicobacter pylori testing.      The cardia and gastric fundus were normal on retroflexion.      The gastroesophageal junction and examined esophagus were normal. Impression:            - Normal duodenal bulb and second portion of the                         duodenum.                        - A Nissen fundoplication was found. The wrap appears                         intact.                        - Erythematous mucosa in the gastric body. Biopsied.                        - Normal incisura and antrum. Biopsied.                        - Normal gastroesophageal junction and esophagus. Recommendation:        - Await pathology results.                        - Proceed with colonoscopy as scheduled                        See colonoscopy report Procedure Code(s):     --- Professional ---                        [phone], Esophagogastroduodenoscopy, flexible,                         transoral; with biopsy, single or multiple Diagnosis Code(s):     --- Professional ---                        X91.478, Other specified postprocedural states                        K31.89, Other diseases of stomach and duodenum  K92.1, Melena (includes Hematochezia) CPT copyright 2019 American Medical Association. All rights reserved. The codes documented in this report are preliminary and upon coder review may  be revised to meet current compliance requirements. Dr. Libby Maw Toney Reil MD, MD 11/22/2020 10:22:56 AM This report has been signed electronically. Number of Addenda: 0 Note Initiated On: 11/22/2020 10:01 AM Total Procedure Duration: 0 hours 4 minutes 7 seconds  Estimated Blood Loss:  Estimated blood loss: none.      [facility]

## 2020-11-22 NOTE — Anesthesia Procedure Notes (Signed)
Date/Time: 11/22/2020 10:13 AM Performed by: Maree Krabbe, CRNA Pre-anesthesia Checklist: Patient identified, Emergency Drugs available, Suction available, Timeout performed and Patient being monitored Patient Re-evaluated:Patient Re-evaluated prior to induction Oxygen Delivery Method: Nasal cannula Placement Confirmation: positive ETCO2

## 2020-11-22 NOTE — H&P (Signed)
Arlyss Repress, MD [address]  [address]  [place], [place] [address]  Main: [phone]  Fax: [phone] Pager: [phone]  Primary Care Physician:  Leanna Sato, MD Primary Gastroenterologist:  Dr. Arlyss Repress  Pre-Procedure History & Physical: HPI:  Andrew Cantrell. is a 70 y.o. male is here for an endoscopy and colonoscopy.   Past Medical History:  Diagnosis Date   Achalasia    Diabetes mellitus without complication (HCC)    Gout    Hypercholesteremia    Hypertension    Wears hearing aid in right ear     Past Surgical History:  Procedure Laterality Date   CHOLECYSTECTOMY     ESOPHAGOGASTRODUODENOSCOPY (EGD) WITH PROPOFOL N/A 08/18/2017   Procedure: ESOPHAGOGASTRODUODENOSCOPY (EGD) WITH PROPOFOL;  Surgeon: Christena Deem, MD;  Location: [facility] ENDOSCOPY;  Service: Endoscopy;  Laterality: N/A;   HERNIA REPAIR      Prior to Admission medications   Medication Sig Start Date End Date Taking? Authorizing Provider  ANDROGEL PUMP 20.25 MG/ACT (1.62%) GEL Apply 2 pump to skin once a day  apply to UE (one pump per arm) 06/25/20  Yes [provider]  aspirin EC 81 MG tablet Take 81 mg by mouth daily.   Yes [provider]  Cholecalciferol (VITAMIN D3 PO) Take by mouth.   Yes [provider]  linagliptin (TRADJENTA) 5 MG TABS tablet Take 5 mg by mouth daily.   Yes [provider]  lovastatin (MEVACOR) 40 MG tablet  02/12/20  Yes [provider]  metFORMIN (GLUCOPHAGE) 500 MG tablet Take by mouth 2 (two) times daily with a meal.   Yes [provider]  Sodium Sulfate-Mag Sulfate-KCl (SUTAB) [phone] MG TABS Take 12 tablets by mouth in the morning and at bedtime. At 5pm take 12 tablets and then 5 hours prior to colonoscopy take the other 12. 10/26/20  Yes Persia Lintner, Loel Dubonnet, MD  lisinopril (PRINIVIL,ZESTRIL) 20 MG tablet Take 20 mg by mouth daily. Patient not taking: Reported on 11/13/2020    [provider]    Allergies as of 10/26/2020 - Review Complete 10/26/2020  Allergen Reaction Noted   Acetaminophen  08/17/2017   Morphine and related  06/08/2016    History reviewed. No pertinent family history.  Social History   Socioeconomic History   Marital status: Married    Spouse name: Not on file   Number of children: Not on file   Years of education: Not on file   Highest education level: Not on file  Occupational History   Not on file  Tobacco Use   Smoking status: Former   Smokeless tobacco: Never  Vaping Use   Vaping Use: Never used  Substance and Sexual Activity   Alcohol use: Yes    Comment: sometimes a beer   Drug use: Never   Sexual activity: Not on file  Other Topics Concern   Not on file  Social History Narrative   Not on file   Social Determinants of Health   Financial Resource Strain: Not on file  Food Insecurity: Not on file  Transportation Needs: Not on file  Physical Activity: Not on file  Stress: Not on file  Social Connections: Not on file  Intimate Partner Violence: Not on file    Review of Systems: See HPI, otherwise negative ROS  Physical Exam: Pulse 62   Temp 97.8 F (36.6 C) (Temporal)   Resp 18   Ht 5\' 3"  (1.6 m)   Wt  68 kg   SpO2 99%   BMI 26.57 kg/m  General:   Alert,  pleasant and cooperative in NAD Head:  Normocephalic and atraumatic. Neck:  Supple; no masses or thyromegaly. Lungs:  Clear throughout to auscultation.    Heart:  Regular rate and rhythm. Abdomen:  Soft, nontender and nondistended. Normal bowel sounds, without guarding, and without rebound.   Neurologic:  Alert and  oriented x4;  grossly normal neurologically.  Impression/Plan: Andrew Christmas Sr. is here for an endoscopy and colonoscopy to be performed for hematochezia  Risks, benefits, limitations, and alternatives regarding  endoscopy and colonoscopy have been reviewed with the patient.  Questions have been answered.  All parties  agreeable.   Lannette Donath, MD  11/22/2020, 9:28 AM

## 2020-11-22 NOTE — Anesthesia Postprocedure Evaluation (Signed)
Anesthesia Post Note  Patient: Andrew GOLEY Sr.  Procedure(s) Performed: COLONOSCOPY WITH PROPOFOL ESOPHAGOGASTRODUODENOSCOPY (EGD) WITH PROPOFOL     Patient location during evaluation: PACU Anesthesia Type: General Level of consciousness: awake and alert and oriented Pain management: satisfactory to patient Vital Signs Assessment: post-procedure vital signs reviewed and stable Respiratory status: spontaneous breathing, nonlabored ventilation and respiratory function stable Cardiovascular status: blood pressure returned to baseline and stable Postop Assessment: Adequate PO intake and No signs of nausea or vomiting Anesthetic complications: no   No notable events documented.  [facility] [place]

## 2020-11-22 NOTE — Op Note (Signed)
[facility] Patient Name: Andrew Cantrell Procedure Date: 11/22/2020 10:01 AM MRN: [id number] Account #: [id number] Date of Birth: 10/26/1950 Admit Type: Outpatient Age: 70 Room: [facility] OR ROOM 01 Gender: Male Note Status: Finalized Instrument Name: [id number] Procedure:             Colonoscopy Indications:           Hematochezia Providers:             Toney Reil MD, MD Medicines:             General Anesthesia Complications:         No immediate complications. Estimated blood loss: None. Procedure:             Pre-Anesthesia Assessment:                        - Prior to the procedure, a History and Physical was                         performed, and patient medications and allergies were                         reviewed. The patient is competent. The risks and                         benefits of the procedure and the sedation options and                         risks were discussed with the patient. All questions                         were answered and informed consent was obtained.                         Patient identification and proposed procedure were                         verified by the physician, the nurse, the                         anesthesiologist, the anesthetist and the technician                         in the pre-procedure area in the procedure room in the                         endoscopy suite. Mental Status Examination: alert and                         oriented. Airway Examination: normal oropharyngeal                         airway and neck mobility. Respiratory Examination:                         clear to auscultation. CV Examination: normal.                         Prophylactic Antibiotics: The patient does not require  prophylactic antibiotics. Prior Anticoagulants: The                         patient has taken no previous anticoagulant or                         antiplatelet agents. ASA  Grade Assessment: II - A                         patient with mild systemic disease. After reviewing                         the risks and benefits, the patient was deemed in                         satisfactory condition to undergo the procedure. The                         anesthesia plan was to use general anesthesia.                         Immediately prior to administration of medications,                         the patient was re-assessed for adequacy to receive                         sedatives. The heart rate, respiratory rate, oxygen                         saturations, blood pressure, adequacy of pulmonary                         ventilation, and response to care were monitored                         throughout the procedure. The physical status of the                         patient was re-assessed after the procedure.                        After obtaining informed consent, the colonoscope was                         passed under direct vision. Throughout the procedure,                         the patient's blood pressure, pulse, and oxygen                         saturations were monitored continuously. The                         Colonoscope was introduced through the anus and                         advanced to the the cecum, identified by appendiceal  orifice and ileocecal valve. The colonoscopy was                         performed without difficulty. The patient tolerated                         the procedure well. The quality of the bowel                         preparation was evaluated using the BBPS [facility] Bowel                         Preparation Scale) with scores of: Right Colon = 3,                         Transverse Colon = 3 and Left Colon = 3 (entire mucosa                         seen well with no residual staining, small fragments                         of stool or opaque liquid). The total BBPS score                         equals  9. Findings:      The perianal and digital rectal examinations were normal. Pertinent       negatives include normal sphincter tone and no palpable rectal lesions.      Non-bleeding external hemorrhoids were found during retroflexion with       stigmata of recent bleeding. The hemorrhoids were medium-sized.      The exam was otherwise without abnormality. Impression:            - Non-bleeding external hemorrhoids, source of                         hematochezia.                        - The examination was otherwise normal.                        - No specimens collected. Recommendation:        - Discharge patient to home (with escort).                        - Resume previous diet today.                        - Continue present medications.                        - Return to my office at the next available                         appointment to discuss about hemorrhoid ligation. Procedure Code(s):     --- Professional ---                        [phone], Colonoscopy, flexible; diagnostic, including  collection of specimen(s) by brushing or washing, when                         performed (separate procedure) Diagnosis Code(s):     --- Professional ---                        K64.4, Residual hemorrhoidal skin tags                        K92.1, Melena (includes Hematochezia) CPT copyright 2019 American Medical Association. All rights reserved. The codes documented in this report are preliminary and upon coder review may  be revised to meet current compliance requirements. Dr. Libby Maw Toney Reil MD, MD 11/22/2020 10:36:05 AM This report has been signed electronically. Number of Addenda: 0 Note Initiated On: 11/22/2020 10:01 AM Scope Withdrawal Time: 0 hours 5 minutes 30 seconds  Total Procedure Duration: 0 hours 8 minutes 28 seconds  Estimated Blood Loss:  Estimated blood loss: none.      [facility]

## 2020-11-23 ENCOUNTER — Encounter: Payer: Self-pay | Admitting: Gastroenterology

## 2020-11-26 ENCOUNTER — Telehealth: Payer: Self-pay

## 2020-11-26 ENCOUNTER — Other Ambulatory Visit: Payer: Self-pay

## 2020-11-26 DIAGNOSIS — K2970 Gastritis, unspecified, without bleeding: Secondary | ICD-10-CM

## 2020-11-26 LAB — SURGICAL PATHOLOGY

## 2020-11-26 NOTE — Telephone Encounter (Signed)
-----   Message from Toney Reil, MD sent at 11/26/2020 12:55 PM EDT ----- Based on gastric biopsy results patient has special type of inflammation in the stomach, recommend to check H. pylori IgG levels. Also, recommend repeat nonurgent EGD for gastric mapping for more detailed biopsies  RV

## 2020-11-26 NOTE — Telephone Encounter (Signed)
Patient scheduled for EGD October 3rd. Patient verbalized understanding of results

## 2020-12-03 ENCOUNTER — Telehealth: Payer: Self-pay | Admitting: Gastroenterology

## 2020-12-03 NOTE — Telephone Encounter (Signed)
Patient informed me that it is okay to talk to his wife had to get approval because there is no DPR form signed. Informed patient and patient wife that he could come to our office or any [facility] drawing station. Gave him the hours of our lab to have the blood done

## 2020-12-03 NOTE — Telephone Encounter (Signed)
Pt. Would like to know if he can get his blood work done somewhere else. He would like a call back to see if he can go to the place he chooses to get his blood work done for his procedure on tomorrow. 12/05/20

## 2020-12-06 LAB — H. PYLORI ANTIBODY, IGG: H. pylori, IgG AbS: 0.53 Index Value (ref 0.00–0.79)

## 2020-12-10 ENCOUNTER — Other Ambulatory Visit: Payer: Self-pay

## 2020-12-10 ENCOUNTER — Ambulatory Visit: Payer: Medicare HMO | Admitting: Certified Registered"

## 2020-12-10 ENCOUNTER — Ambulatory Visit
Admission: RE | Admit: 2020-12-10 | Discharge: 2020-12-10 | Disposition: A | Discharge status: Home / Self Care | Payer: Medicare HMO | Attending: Gastroenterology | Admitting: Gastroenterology

## 2020-12-10 ENCOUNTER — Encounter
Admission: RE | Disposition: A | Discharge status: Home / Self Care | Payer: Self-pay | Source: Home / Self Care | Attending: Gastroenterology

## 2020-12-10 ENCOUNTER — Encounter: Payer: Self-pay | Admitting: Gastroenterology

## 2020-12-10 DIAGNOSIS — Z885 Allergy status to narcotic agent status: Secondary | ICD-10-CM | POA: Diagnosis not present

## 2020-12-10 DIAGNOSIS — Z7984 Long term (current) use of oral hypoglycemic drugs: Secondary | ICD-10-CM | POA: Insufficient documentation

## 2020-12-10 DIAGNOSIS — K31A19 Gastric intestinal metaplasia without dysplasia, unspecified site: Secondary | ICD-10-CM | POA: Diagnosis present

## 2020-12-10 DIAGNOSIS — E119 Type 2 diabetes mellitus without complications: Secondary | ICD-10-CM | POA: Insufficient documentation

## 2020-12-10 DIAGNOSIS — Z886 Allergy status to analgesic agent status: Secondary | ICD-10-CM | POA: Diagnosis not present

## 2020-12-10 DIAGNOSIS — Z9049 Acquired absence of other specified parts of digestive tract: Secondary | ICD-10-CM | POA: Insufficient documentation

## 2020-12-10 DIAGNOSIS — Z9889 Other specified postprocedural states: Secondary | ICD-10-CM | POA: Insufficient documentation

## 2020-12-10 DIAGNOSIS — K31A0 Gastric intestinal metaplasia, unspecified: Secondary | ICD-10-CM | POA: Diagnosis not present

## 2020-12-10 DIAGNOSIS — Z79899 Other long term (current) drug therapy: Secondary | ICD-10-CM | POA: Insufficient documentation

## 2020-12-10 DIAGNOSIS — Z87891 Personal history of nicotine dependence: Secondary | ICD-10-CM | POA: Insufficient documentation

## 2020-12-10 DIAGNOSIS — Z7982 Long term (current) use of aspirin: Secondary | ICD-10-CM | POA: Insufficient documentation

## 2020-12-10 DIAGNOSIS — K2950 Unspecified chronic gastritis without bleeding: Secondary | ICD-10-CM | POA: Insufficient documentation

## 2020-12-10 DIAGNOSIS — K319 Disease of stomach and duodenum, unspecified: Secondary | ICD-10-CM

## 2020-12-10 DIAGNOSIS — K2970 Gastritis, unspecified, without bleeding: Secondary | ICD-10-CM

## 2020-12-10 HISTORY — PX: ESOPHAGOGASTRODUODENOSCOPY (EGD) WITH PROPOFOL: SHX5813

## 2020-12-10 LAB — GLUCOSE, CAPILLARY: Glucose-Capillary: 152 mg/dL — ABNORMAL HIGH (ref 70–99)

## 2020-12-10 SURGERY — ESOPHAGOGASTRODUODENOSCOPY (EGD) WITH PROPOFOL
Anesthesia: General

## 2020-12-10 MED ORDER — ONDANSETRON HCL 4 MG/2ML IJ SOLN: 4.0000 mg | Freq: Once | INTRAMUSCULAR | Status: DC | PRN

## 2020-12-10 MED ORDER — PROPOFOL 10 MG/ML IV BOLUS
INTRAVENOUS | Status: DC | PRN
  Administered 2020-12-10 (×2): 20 mg via INTRAVENOUS
  Administered 2020-12-10: 60 mg via INTRAVENOUS

## 2020-12-10 MED ORDER — SODIUM CHLORIDE 0.9 % IV SOLN: INTRAVENOUS | Status: DC

## 2020-12-10 NOTE — H&P (Signed)
Arlyss Repress, MD [address]  [address]  [place], [place] [address]  Main: [phone]  Fax: [phone] Pager: [phone]  Primary Care Physician:  Leanna Sato, MD Primary Gastroenterologist:  Dr. Arlyss Repress  Pre-Procedure History & Physical: HPI:  Andrew KURKA Sr. is a 70 y.o. male is here for an endoscopy.   Past Medical History:  Diagnosis Date   Achalasia    Diabetes mellitus without complication (HCC)    Gout    Hypercholesteremia    Hypertension    Wears hearing aid in right ear     Past Surgical History:  Procedure Laterality Date   CHOLECYSTECTOMY     COLONOSCOPY WITH PROPOFOL N/A 11/22/2020   Procedure: COLONOSCOPY WITH PROPOFOL;  Surgeon: Toney Reil, MD;  Location: [facility];  Service: Endoscopy;  Laterality: N/A;   ESOPHAGOGASTRODUODENOSCOPY (EGD) WITH PROPOFOL N/A 08/18/2017   Procedure: ESOPHAGOGASTRODUODENOSCOPY (EGD) WITH PROPOFOL;  Surgeon: Christena Deem, MD;  Location: [facility] ENDOSCOPY;  Service: Endoscopy;  Laterality: N/A;   ESOPHAGOGASTRODUODENOSCOPY (EGD) WITH PROPOFOL N/A 11/22/2020   Procedure: ESOPHAGOGASTRODUODENOSCOPY (EGD) WITH PROPOFOL;  Surgeon: Toney Reil, MD;  Location: [facility];  Service: Endoscopy;  Laterality: N/A;  Diabetic   HERNIA REPAIR      Prior to Admission medications   Medication Sig Start Date End Date Taking? Authorizing Provider  aspirin EC 81 MG tablet Take 81 mg by mouth daily.   Yes [provider]  Cholecalciferol (VITAMIN D3 PO) Take by mouth.   Yes [provider]  linagliptin (TRADJENTA) 5 MG TABS tablet Take 5 mg by mouth daily.   Yes [provider]  lovastatin (MEVACOR) 40 MG tablet  02/12/20  Yes [provider]  metFORMIN (GLUCOPHAGE) 500 MG tablet Take by mouth 2 (two) times daily with a meal.   Yes [provider]  ANDROGEL PUMP 20.25 MG/ACT (1.62%) GEL Apply 2 pump to skin once a day  apply to UE (one  pump per arm) 06/25/20   [provider]    Allergies as of 11/26/2020 - Review Complete 11/22/2020  Allergen Reaction Noted   Acetaminophen  08/17/2017   Morphine and related  06/08/2016    History reviewed. No pertinent family history.  Social History   Socioeconomic History   Marital status: Married    Spouse name: Not on file   Number of children: Not on file   Years of education: Not on file   Highest education level: Not on file  Occupational History   Not on file  Tobacco Use   Smoking status: Former   Smokeless tobacco: Never  Vaping Use   Vaping Use: Never used  Substance and Sexual Activity   Alcohol use: Yes    Comment: sometimes a beer   Drug use: Never   Sexual activity: Not on file  Other Topics Concern   Not on file  Social History Narrative   Not on file   Social Determinants of Health   Financial Resource Strain: Not on file  Food Insecurity: Not on file  Transportation Needs: Not on file  Physical Activity: Not on file  Stress: Not on file  Social Connections: Not on file  Intimate Partner Violence: Not on file    Review of Systems: See HPI, otherwise negative ROS  Physical Exam: BP 118/76   Pulse 73   Temp (!) 97.3 F (36.3 C) (Temporal)   Resp 16   Ht 5' 3.5" (1.613 m)  Wt 69.9 kg   SpO2 99%   BMI 26.85 kg/m  General:   Alert,  pleasant and cooperative in NAD Head:  Normocephalic and atraumatic. Neck:  Supple; no masses or thyromegaly. Lungs:  Clear throughout to auscultation.    Heart:  Regular rate and rhythm. Abdomen:  Soft, nontender and nondistended. Normal bowel sounds, without guarding, and without rebound.   Neurologic:  Alert and  oriented x4;  grossly normal neurologically.  Impression/Plan: Andrew Schneiders Sr. is here for an endoscopy to be performed for gastric mapping, h/o intestinal metaplasia  Risks, benefits, limitations, and alternatives regarding  endoscopy have been reviewed with the  patient.  Questions have been answered.  All parties agreeable.   Lannette Donath, MD  12/10/2020, 11:00 AM

## 2020-12-10 NOTE — Transfer of Care (Signed)
Immediate Anesthesia Transfer of Care Note  Patient: Andrew PREHN Sr.  Procedure(s) Performed: ESOPHAGOGASTRODUODENOSCOPY (EGD) WITH PROPOFOL  Patient Location: PACU and Endoscopy Unit  Anesthesia Type:General  Level of Consciousness: drowsy and patient cooperative  Airway & Oxygen Therapy: Patient Spontanous Breathing  Post-op Assessment: Report given to RN and Post -op Vital signs reviewed and stable  Post vital signs: Reviewed and stable  Last Vitals:  Vitals Value Taken Time  BP 92/66 12/10/20 1130  Temp 35.9 C 12/10/20 1130  Pulse 76 12/10/20 1130  Resp 16 12/10/20 1130  SpO2 98 % 12/10/20 1130  Vitals shown include unvalidated device data.  Last Pain:  Vitals:   12/10/20 1130  TempSrc: Tympanic  PainSc: Asleep         Complications: No notable events documented.

## 2020-12-10 NOTE — Op Note (Signed)
[facility] Gastroenterology Patient Name: Andrew Cantrell Procedure Date: 12/10/2020 11:00 AM MRN: [id number] Account #: [id number] Date of Birth: May 11, 1950 Admit Type: Outpatient Age: 70 Room: [facility] ENDO ROOM 1 Gender: Male Note Status: Finalized Instrument Name: Upper Endoscope [id number] Procedure:             Upper GI endoscopy Indications:           Intestinal metaplasia, Follow-up of intestinal                         metaplasia Providers:             Toney Reil MD, MD Referring MD:          Leanna Sato, MD (Referring MD) Medicines:             General Anesthesia Complications:         No immediate complications. Estimated blood loss: None. Procedure:             Pre-Anesthesia Assessment:                        - Prior to the procedure, a History and Physical was                         performed, and patient medications and allergies were                         reviewed. The patient is competent. The risks and                         benefits of the procedure and the sedation options and                         risks were discussed with the patient. All questions                         were answered and informed consent was obtained.                         Patient identification and proposed procedure were                         verified by the physician, the nurse, the                         anesthesiologist, the anesthetist and the technician                         in the pre-procedure area in the procedure room in the                         endoscopy suite. Mental Status Examination: alert and                         oriented. Airway Examination: normal oropharyngeal                         airway and neck mobility. Respiratory Examination:  clear to auscultation. CV Examination: normal.                         Prophylactic Antibiotics: The patient does not require                         prophylactic  antibiotics. Prior Anticoagulants: The                         patient has taken no previous anticoagulant or                         antiplatelet agents. ASA Grade Assessment: II - A                         patient with mild systemic disease. After reviewing                         the risks and benefits, the patient was deemed in                         satisfactory condition to undergo the procedure. The                         anesthesia plan was to use general anesthesia.                         Immediately prior to administration of medications,                         the patient was re-assessed for adequacy to receive                         sedatives. The heart rate, respiratory rate, oxygen                         saturations, blood pressure, adequacy of pulmonary                         ventilation, and response to care were monitored                         throughout the procedure. The physical status of the                         patient was re-assessed after the procedure.                        After obtaining informed consent, the endoscope was                         passed under direct vision. Throughout the procedure,                         the patient's blood pressure, pulse, and oxygen                         saturations were monitored continuously. The Endoscope  was introduced through the mouth, and advanced to the                         second part of duodenum. The upper GI endoscopy was                         accomplished without difficulty. The patient tolerated                         the procedure well. Findings:      The duodenal bulb and second portion of the duodenum were normal.      Diffuse mild inflammation characterized by erythema and friability was       found in the gastric body, at the incisura and in the gastric antrum.       Gastric mapping was performed given patient's history of intestinal       metaplasia. Biopsies  were taken with a cold forceps for histology.      Evidence of a Nissen fundoplication was found in the cardia. The wrap       appeared intact. This was traversed.      The gastroesophageal junction and examined esophagus were normal. Impression:            - Normal duodenal bulb and second portion of the                         duodenum.                        - Chronic gastritis. Biopsied.                        - A Nissen fundoplication was found. The wrap appears                         intact.                        - Normal gastroesophageal junction and esophagus. Recommendation:        - Await pathology results.                        - Discharge patient to home (with escort).                        - Resume previous diet today.                        - Return to my office as previously scheduled. Procedure Code(s):     --- Professional ---                        [id number], Esophagogastroduodenoscopy, flexible,                         transoral; with biopsy, single or multiple Diagnosis Code(s):     --- Professional ---                        K29.50, Unspecified chronic gastritis without bleeding  Z98.890, Other specified postprocedural states                        K31.89, Other diseases of stomach and duodenum CPT copyright 2019 American Medical Association. All rights reserved. The codes documented in this report are preliminary and upon coder review may  be revised to meet current compliance requirements. Dr. Libby Maw Toney Reil MD, MD 12/10/2020 11:29:39 AM This report has been signed electronically. Number of Addenda: 0 Note Initiated On: 12/10/2020 11:00 AM Estimated Blood Loss:  Estimated blood loss: none.      [facility]

## 2020-12-10 NOTE — Anesthesia Procedure Notes (Signed)
Procedure Name: MAC Date/Time: 12/10/2020 11:09 AM Performed by: Jerrye Noble, CRNA Pre-anesthesia Checklist: Patient identified, Emergency Drugs available, Suction available and Patient being monitored Patient Re-evaluated:Patient Re-evaluated prior to induction Oxygen Delivery Method: Nasal cannula

## 2020-12-10 NOTE — Anesthesia Postprocedure Evaluation (Signed)
Anesthesia Post Note  Patient: Andrew Cantrell.  Procedure(s) Performed: ESOPHAGOGASTRODUODENOSCOPY (EGD) WITH PROPOFOL  Patient location during evaluation: PACU Anesthesia Type: General Level of consciousness: awake and alert, oriented and patient cooperative Pain management: pain level controlled Vital Signs Assessment: post-procedure vital signs reviewed and stable Respiratory status: spontaneous breathing, nonlabored ventilation and respiratory function stable Cardiovascular status: blood pressure returned to baseline and stable Postop Assessment: adequate PO intake Anesthetic complications: no   No notable events documented.   Last Vitals:  Vitals:   12/10/20 1130 12/10/20 1157  BP: 92/66 114/73  Pulse: 76   Resp: 17   Temp: (!) 35.9 C   SpO2: 98%     Last Pain:  Vitals:   12/10/20 1150  TempSrc:   PainSc: 0-No pain                 Reed Breech

## 2020-12-10 NOTE — Anesthesia Preprocedure Evaluation (Signed)
Anesthesia Evaluation  Patient identified by MRN, date of birth, ID band Patient awake    Reviewed: Allergy & Precautions, NPO status , Patient's Chart, lab work & pertinent test results  History of Anesthesia Complications Negative for: history of anesthetic complications  Airway Mallampati: III   Neck ROM: Full    Dental  (+) Missing   Pulmonary former smoker (quit 25 years ago),    Pulmonary exam normal breath sounds clear to auscultation       Cardiovascular hypertension, Normal cardiovascular exam Rhythm:Regular Rate:Normal     Neuro/Psych negative neurological ROS     GI/Hepatic negative GI ROS,   Endo/Other  diabetes  Renal/GU negative Renal ROS     Musculoskeletal   Abdominal   Peds  Hematology negative hematology ROS (+)   Anesthesia Other Findings   Reproductive/Obstetrics                             Anesthesia Physical Anesthesia Plan  ASA: 2  Anesthesia Plan: General   Post-op Pain Management:    Induction: Intravenous  PONV Risk Score and Plan: 2 and Propofol infusion, TIVA and Treatment may vary due to age or medical condition  Airway Management Planned: Natural Airway  Additional Equipment:   Intra-op Plan:   Post-operative Plan:   Informed Consent: I have reviewed the patients History and Physical, chart, labs and discussed the procedure including the risks, benefits and alternatives for the proposed anesthesia with the patient or authorized representative who has indicated his/her understanding and acceptance.       Plan Discussed with: CRNA  Anesthesia Plan Comments:         Anesthesia Quick Evaluation

## 2020-12-11 ENCOUNTER — Encounter: Payer: Self-pay | Admitting: Gastroenterology

## 2020-12-12 ENCOUNTER — Other Ambulatory Visit: Payer: Self-pay

## 2020-12-12 ENCOUNTER — Emergency Department: Payer: Medicare HMO

## 2020-12-12 ENCOUNTER — Emergency Department
Admission: EM | Admit: 2020-12-12 | Discharge: 2020-12-12 | Disposition: A | Discharge status: Home / Self Care | Payer: Medicare HMO | Attending: Emergency Medicine | Admitting: Emergency Medicine

## 2020-12-12 DIAGNOSIS — Z87891 Personal history of nicotine dependence: Secondary | ICD-10-CM | POA: Diagnosis not present

## 2020-12-12 DIAGNOSIS — S81812A Laceration without foreign body, left lower leg, initial encounter: Secondary | ICD-10-CM | POA: Diagnosis not present

## 2020-12-12 DIAGNOSIS — S8992XA Unspecified injury of left lower leg, initial encounter: Secondary | ICD-10-CM | POA: Diagnosis present

## 2020-12-12 DIAGNOSIS — I10 Essential (primary) hypertension: Secondary | ICD-10-CM | POA: Insufficient documentation

## 2020-12-12 DIAGNOSIS — Z7982 Long term (current) use of aspirin: Secondary | ICD-10-CM | POA: Diagnosis not present

## 2020-12-12 DIAGNOSIS — E119 Type 2 diabetes mellitus without complications: Secondary | ICD-10-CM | POA: Insufficient documentation

## 2020-12-12 DIAGNOSIS — Z7984 Long term (current) use of oral hypoglycemic drugs: Secondary | ICD-10-CM | POA: Insufficient documentation

## 2020-12-12 DIAGNOSIS — M25571 Pain in right ankle and joints of right foot: Secondary | ICD-10-CM

## 2020-12-12 DIAGNOSIS — W11XXXA Fall on and from ladder, initial encounter: Secondary | ICD-10-CM | POA: Insufficient documentation

## 2020-12-12 MED ORDER — OXYCODONE HCL 5 MG PO TABS
5.0000 mg | ORAL_TABLET | ORAL | Status: AC
  Administered 2020-12-12: 5 mg via ORAL
  Filled 2020-12-12: qty 1

## 2020-12-12 MED ORDER — FENTANYL CITRATE PF 50 MCG/ML IJ SOSY
50.0000 ug | PREFILLED_SYRINGE | Freq: Once | INTRAMUSCULAR | Status: AC
  Administered 2020-12-12: 50 ug via INTRAVENOUS
  Filled 2020-12-12: qty 1

## 2020-12-12 MED ORDER — NAPROXEN 500 MG PO TABS
500.0000 mg | ORAL_TABLET | Freq: Once | ORAL | Status: AC
  Administered 2020-12-12: 500 mg via ORAL
  Filled 2020-12-12: qty 1
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
## 2020-12-12 NOTE — ED Notes (Signed)
Pt ambulated in room with walker with steady gait, denies new or different pain.  MD aware and visualized patient.

## 2020-12-12 NOTE — ED Notes (Signed)
E-signature pad unavailable - Pt & Pts Spouse verbalized understanding of D/C information - no additional concerns at this time.   Pt provided a walker at discharge - Education provided.

## 2020-12-12 NOTE — ED Triage Notes (Signed)
Pt presents to ER after falling appx 12 feet from a ladder.  Pt states he was standing with ladder between his legs, when the ladder slipped from underneath him and fell to the ground, and pt landed face down on top of ladder.  Pt only c/o pain in bil feet and left shin and knee.  Pt A&O x4 at this time.  Pt not c/o neck or abd pain and denies loc.

## 2020-12-13 LAB — SURGICAL PATHOLOGY

## 2020-12-17 ENCOUNTER — Telehealth: Payer: Self-pay

## 2020-12-17 NOTE — Telephone Encounter (Signed)
Spoke with patient he understands his results from his procedure

## 2020-12-17 NOTE — Telephone Encounter (Signed)
-----   Message from Andrew Reil, MD sent at 12/17/2020  8:56 AM EDT ----- Please inform patient that the pathology results from his stomach came back as benign and nothing to worry with regards to his inflammation.  He will not need any further endoscopy at this time  Rohini Vanga

## 2021-02-19 ENCOUNTER — Other Ambulatory Visit: Payer: Self-pay | Admitting: *Deleted

## 2021-02-19 DIAGNOSIS — R972 Elevated prostate specific antigen [PSA]: Secondary | ICD-10-CM

## 2021-02-21 ENCOUNTER — Encounter: Payer: Self-pay | Admitting: Urology

## 2021-02-21 ENCOUNTER — Other Ambulatory Visit: Payer: Self-pay

## 2021-02-22 ENCOUNTER — Telehealth: Payer: Self-pay | Admitting: *Deleted

## 2021-02-22 DIAGNOSIS — R972 Elevated prostate specific antigen [PSA]: Secondary | ICD-10-CM

## 2021-02-22 NOTE — Telephone Encounter (Signed)
Patient contacted to get PSA prior to office visit, will get done at [facility] lab. Orders placed.

## 2021-02-24 NOTE — Progress Notes (Incomplete)
02/24/21 9:12 PM   Andrew Cantrell Sr. 02/26/1951 [id number]  Referring provider:  Leanna Sato, MD [address] [place],  [place] [address] No chief complaint on file.    HPI: Andrew CASILLAS Sr. is a 70 y.o.male with a personal history of elevated PSA and urinary frequency who returns today for annual follow-up.  Most recent PSA on ***   PMH: Past Medical History:  Diagnosis Date   Achalasia    Diabetes mellitus without complication (HCC)    Gout    Hypercholesteremia    Hypertension    Wears hearing aid in right ear     Surgical History: Past Surgical History:  Procedure Laterality Date   CHOLECYSTECTOMY     COLONOSCOPY WITH PROPOFOL N/A 11/22/2020   Procedure: COLONOSCOPY WITH PROPOFOL;  Surgeon: Toney Reil, MD;  Location: [facility];  Service: Endoscopy;  Laterality: N/A;   ESOPHAGOGASTRODUODENOSCOPY (EGD) WITH PROPOFOL N/A 08/18/2017   Procedure: ESOPHAGOGASTRODUODENOSCOPY (EGD) WITH PROPOFOL;  Surgeon: Christena Deem, MD;  Location: [facility] ENDOSCOPY;  Service: Endoscopy;  Laterality: N/A;   ESOPHAGOGASTRODUODENOSCOPY (EGD) WITH PROPOFOL N/A 11/22/2020   Procedure: ESOPHAGOGASTRODUODENOSCOPY (EGD) WITH PROPOFOL;  Surgeon: Toney Reil, MD;  Location: [facility];  Service: Endoscopy;  Laterality: N/A;  Diabetic   ESOPHAGOGASTRODUODENOSCOPY (EGD) WITH PROPOFOL N/A 12/10/2020   Procedure: ESOPHAGOGASTRODUODENOSCOPY (EGD) WITH PROPOFOL;  Surgeon: Toney Reil, MD;  Location: [facility] ENDOSCOPY;  Service: Gastroenterology;  Laterality: N/A;   HERNIA REPAIR      Home Medications:  Allergies as of 02/26/2021       Reactions   Acetaminophen Other (See Comments)   "Gives me a rush in my chest"   Morphine And Related Other (See Comments)   "Felt like I was having a heart attack"        Medication List        Accurate as of February 24, 2021  9:12 PM. If you have any questions, ask your nurse or doctor.           AndroGel Pump 20.25 MG/ACT (1.62%) Gel Generic drug: Testosterone Apply 2 pump to skin once a day  apply to UE (one pump per arm)   aspirin EC 81 MG tablet Take 81 mg by mouth daily.   linagliptin 5 MG Tabs tablet Commonly known as: TRADJENTA Take 5 mg by mouth daily.   lovastatin 40 MG tablet Commonly known as: MEVACOR   metFORMIN 500 MG tablet Commonly known as: GLUCOPHAGE Take by mouth 2 (two) times daily with a meal.   VITAMIN D3 PO Take by mouth.        Allergies:  Allergies  Allergen Reactions   Acetaminophen Other (See Comments)    "Gives me a rush in my chest"   Morphine And Related Other (See Comments)    "Felt like I was having a heart attack"    Family History: No family history on file.  Social History:  reports that he has quit smoking. He has never used smokeless tobacco. He reports current alcohol use. He reports that he does not use drugs.   Physical Exam: There were no vitals taken for this visit.  Constitutional:  Alert and oriented, No acute distress. HEENT: [name], moist mucus membranes.  Trachea midline, no masses. Cardiovascular: No clubbing, cyanosis, or edema. Respiratory: Normal respiratory effort, no increased work of breathing. Skin: No rashes, bruises or suspicious lesions. Neurologic: Grossly intact, no focal deficits, moving all 4 extremities. Psychiatric: Normal mood  and affect.  Laboratory Data:  Lab Results  Component Value Date   CREATININE 1.22 11/01/2017   Lab Results  Component Value Date   HGBA1C 7.3 (H) 10/18/2012   Urinalysis   Pertinent Imaging:    Assessment & Plan:     No follow-ups on file.  I,Kailey Littlejohn,acting as a Neurosurgeon for Vanna Scotland, MD.,have documented all relevant documentation on the behalf of Vanna Scotland, MD,as directed by  Vanna Scotland, MD while in the presence of Vanna Scotland, MD.   [facility] [address], [address] [place], [place] [address] [phone]

## 2021-02-26 ENCOUNTER — Ambulatory Visit: Payer: Self-pay | Admitting: Urology

## 2021-03-06 ENCOUNTER — Encounter: Payer: Self-pay | Admitting: Urology

## 2021-03-14 ENCOUNTER — Encounter: Payer: Self-pay | Admitting: *Deleted

## 2021-03-14 ENCOUNTER — Emergency Department: Payer: Medicare HMO

## 2021-03-14 ENCOUNTER — Other Ambulatory Visit: Payer: Self-pay

## 2021-03-14 ENCOUNTER — Emergency Department
Admission: EM | Admit: 2021-03-14 | Discharge: 2021-03-14 | Disposition: A | Discharge status: Home / Self Care | Payer: Medicare HMO | Attending: Emergency Medicine | Admitting: Emergency Medicine

## 2021-03-14 DIAGNOSIS — J111 Influenza due to unidentified influenza virus with other respiratory manifestations: Secondary | ICD-10-CM | POA: Insufficient documentation

## 2021-03-14 DIAGNOSIS — U071 COVID-19: Secondary | ICD-10-CM | POA: Diagnosis not present

## 2021-03-14 DIAGNOSIS — E119 Type 2 diabetes mellitus without complications: Secondary | ICD-10-CM | POA: Insufficient documentation

## 2021-03-14 DIAGNOSIS — R059 Cough, unspecified: Secondary | ICD-10-CM | POA: Diagnosis present

## 2021-03-14 LAB — RESP PANEL BY RT-PCR (FLU A&B, COVID) ARPGX2
Influenza A by PCR: NEGATIVE
Influenza B by PCR: NEGATIVE
SARS Coronavirus 2 by RT PCR: POSITIVE — AB

## 2021-03-14 MED ORDER — CETIRIZINE HCL 10 MG PO TABS: 10.0000 mg | ORAL_TABLET | Freq: Every day | ORAL | 0 refills | Status: DC

## 2021-03-14 MED ORDER — ALBUTEROL SULFATE HFA 108 (90 BASE) MCG/ACT IN AERS: 2.0000 | INHALATION_SPRAY | Freq: Four times a day (QID) | RESPIRATORY_TRACT | 2 refills | Status: DC | PRN

## 2021-03-14 MED ORDER — BENZONATATE 100 MG PO CAPS: 100.0000 mg | ORAL_CAPSULE | Freq: Three times a day (TID) | ORAL | 0 refills | Status: AC | PRN

## 2021-03-14 NOTE — Discharge Instructions (Addendum)
You can take Tessalon Perles at night for cough.  You can take 2 before bed. Intake 10 mg of Zyrtec to help with postnasal drip. He can take 2 puffs of albuterol every 4 hours as needed for wheezing.

## 2021-03-14 NOTE — ED Provider Notes (Signed)
[facility] Provider Note  Patient Contact: 9:35 PM (approximate)   History   Influenza   HPI  Andrew GIKAS Sr. is a 71 y.o. male with a history of diabetes presents to the emergency department with cough, nasal congestion and rhinorrhea.  Patient's wife is positive for COVID-19.  No chest pain, chest tightness or shortness of breath.  No vomiting or diarrhea.      Physical Exam   Triage Vital Signs: ED Triage Vitals [03/14/21 1840]  Enc Vitals Group     BP 113/70     Pulse Rate 100     Resp 20     Temp 99.4 F (37.4 C)     Temp Source Oral     SpO2 98 %     Weight 157 lb (71.2 kg)     Height 5\' 3"  (1.6 m)     Head Circumference      Peak Flow      Pain Score      Pain Loc      Pain Edu?      Excl. in GC?     Most recent vital signs: Vitals:   03/14/21 1840  BP: 113/70  Pulse: 100  Resp: 20  Temp: 99.4 F (37.4 C)  SpO2: 98%     Constitutional: Alert and oriented. Patient is lying supine. Eyes: Conjunctivae are normal. PERRL. EOMI. Head: Atraumatic. ENT:      Ears: Tympanic membranes are mildly injected with mild effusion bilaterally.       Nose: No congestion/rhinnorhea.      Mouth/Throat: Mucous membranes are moist. Posterior pharynx is mildly erythematous.  Hematological/Lymphatic/Immunilogical: No cervical lymphadenopathy.  Cardiovascular: Normal rate, regular rhythm. Normal S1 and S2.  Good peripheral circulation. Respiratory: Normal respiratory effort without tachypnea or retractions. Lungs CTAB. Good air entry to the bases with no decreased or absent breath sounds. Gastrointestinal: Bowel sounds 4 quadrants. Soft and nontender to palpation. No guarding or rigidity. No palpable masses. No distention. No CVA tenderness. Musculoskeletal: Full range of motion to all extremities. No gross deformities appreciated. Neurologic:  Normal speech and language. No gross focal neurologic deficits are appreciated.  Skin:   Skin is warm, dry and intact. No rash noted. Psychiatric: Mood and affect are normal. Speech and behavior are normal. Patient exhibits appropriate insight and judgement.   ED Results / Procedures / Treatments   Labs (all labs ordered are listed, but only abnormal results are displayed) Labs Reviewed  RESP PANEL BY RT-PCR (FLU A&B, COVID) ARPGX2 - Abnormal; Notable for the following components:      Result Value   SARS Coronavirus 2 by RT PCR POSITIVE (*)    All other components within normal limits       RADIOLOGY  I personally viewed and evaluated these images as part of my medical decision making, as well as reviewing the written report by the radiologist.  ED Provider Interpretation: No consolidations, opacities or infiltrates.    IMPRESSION / MDM / ASSESSMENT AND PLAN / ED COURSE  I reviewed the triage vital signs and the nursing notes.                              Differential diagnosis includes, but is not limited to, COVID-19, influenza, pneumonia  Assessment and plan Covid 60:  71 year old male presents to the emergency department with cough, nasal congestion and low-grade fever.  Vital signs are reassuring  at triage.  On physical exam, patient was alert, active and nontoxic-appearing.  COVID-19, influenza A, influenza B and chest x-ray were ordered.  Patient tested positive for COVID-19.  No consolidations, opacities or infiltrates on chest x-ray.  We will treat with Tessalon Perles, albuterol inhaler and Zyrtec at home.  Return precautions were given to return with new or worsening symptoms.  Quarantine precautions were given.      FINAL CLINICAL IMPRESSION(S) / ED DIAGNOSES   Final diagnoses:  COVID-19     Rx / DC Orders   ED Discharge Orders          Ordered    benzonatate (TESSALON PERLES) 100 MG capsule  3 times daily PRN        03/14/21 2132    albuterol (VENTOLIN HFA) 108 (90 Base) MCG/ACT inhaler  Every 6 hours PRN        03/14/21 2132     cetirizine (ZYRTEC ALLERGY) 10 MG tablet  Daily        03/14/21 2132             Note:  This document was prepared using Dragon voice recognition software and may include unintentional dictation errors.   Pia Mau [place], Cordelia Poche 03/14/21 2138    Phineas Semen, MD 03/14/21 [phone]

## 2021-03-14 NOTE — ED Triage Notes (Incomplete)
Pt has flu like sx for 1 month.  Pt has a cough and congestion for 1 month.   Pt alert  speech clear.

## 2022-07-05 ENCOUNTER — Emergency Department
Admission: EM | Admit: 2022-07-05 | Discharge: 2022-07-06 | Disposition: A | Discharge status: Home / Self Care | Payer: Medicare HMO | Attending: Emergency Medicine | Admitting: Emergency Medicine

## 2022-07-05 ENCOUNTER — Emergency Department: Payer: Medicare HMO

## 2022-07-05 ENCOUNTER — Other Ambulatory Visit: Payer: Self-pay

## 2022-07-05 DIAGNOSIS — R5383 Other fatigue: Secondary | ICD-10-CM | POA: Diagnosis present

## 2022-07-05 DIAGNOSIS — R739 Hyperglycemia, unspecified: Secondary | ICD-10-CM

## 2022-07-05 DIAGNOSIS — E1165 Type 2 diabetes mellitus with hyperglycemia: Secondary | ICD-10-CM | POA: Diagnosis not present

## 2022-07-05 DIAGNOSIS — Z794 Long term (current) use of insulin: Secondary | ICD-10-CM | POA: Insufficient documentation

## 2022-07-05 DIAGNOSIS — R079 Chest pain, unspecified: Secondary | ICD-10-CM | POA: Insufficient documentation

## 2022-07-05 LAB — CBC
HCT: 41.2 % (ref 39.0–52.0)
Hemoglobin: 13.9 g/dL (ref 13.0–17.0)
MCH: 30.2 pg (ref 26.0–34.0)
MCHC: 33.7 g/dL (ref 30.0–36.0)
MCV: 89.6 fL (ref 80.0–100.0)
Platelets: 231 10*3/uL (ref 150–400)
RBC: 4.6 MIL/uL (ref 4.22–5.81)
RDW: 12.2 % (ref 11.5–15.5)
WBC: 11.9 10*3/uL — ABNORMAL HIGH (ref 4.0–10.5)
nRBC: 0 % (ref 0.0–0.2)

## 2022-07-05 LAB — BASIC METABOLIC PANEL
Anion gap: 11 (ref 5–15)
BUN: 23 mg/dL (ref 8–23)
CO2: 19 mmol/L — ABNORMAL LOW (ref 22–32)
Calcium: 9.1 mg/dL (ref 8.9–10.3)
Chloride: 104 mmol/L (ref 98–111)
Creatinine, Ser: 1.68 mg/dL — ABNORMAL HIGH (ref 0.61–1.24)
GFR, Estimated: 43 mL/min — ABNORMAL LOW (ref >60)
Glucose, Bld: 433 mg/dL — ABNORMAL HIGH (ref 70–99)
Potassium: 4.4 mmol/L (ref 3.5–5.1)
Sodium: 134 mmol/L — ABNORMAL LOW (ref 135–145)

## 2022-07-05 LAB — TROPONIN I (HIGH SENSITIVITY): Troponin I (High Sensitivity): 3 ng/L (ref <18)

## 2022-07-05 NOTE — ED Provider Notes (Signed)
[facility] Provider Note    Event Date/Time   First MD Initiated Contact with Patient 07/05/22 2353     (approximate)   History   Chest Pain and Loss of Consciousness   HPI  Andrew MAHLER Sr. is a 72 y.o. male who presents to the ED for evaluation of Chest Pain and Loss of Consciousness   Patient presents with his wife for evaluation of an episode of dizziness that occurred in the shower this afternoon.  Reports feeling faint and having to call out to his wife who reports he did not lose consciousness or fall to the ground, but she had to help him in the shower.  He reports subacute fatigue and weakness for the past 1-[redacted] weeks alongside inconsistently taking his diabetic oral medications.  He reports starting to run out of his medications recently, he is out of his Jearld Lesch and has been spacing out his metformin more than normal due to short supply.  Has not been reliably checking his fingerstick glucose levels.  He reports that since he was in the shower today he has been feeling some mild chest discomfort or "weird sensation."  Denies any exertional chest discomfort prior to this, denies any chest discomfort when cutting the grass prior to being in the shower.  Physical Exam   Triage Vital Signs: ED Triage Vitals  Enc Vitals Group     BP 07/05/22 1928 111/66     Pulse Rate 07/05/22 1928 (!) 108     Resp 07/05/22 1928 17     Temp 07/05/22 1928 98.1 F (36.7 C)     Temp src --      SpO2 07/05/22 1928 96 %     Weight 07/05/22 1929 157 lb (71.2 kg)     Height 07/05/22 1929 5\' 3"  (1.6 m)     Head Circumference --      Peak Flow --      Pain Score 07/05/22 1929 0     Pain Loc --      Pain Edu? --      Excl. in GC? --     Most recent vital signs: Vitals:   07/06/22 0345 07/06/22 0444  BP:    Pulse: 89   Resp: 20 18  Temp:  98.2 F (36.8 C)  SpO2: 97% 97%    General: Awake, no distress.  Well-appearing, pleasant and  conversational. CV:  Good peripheral perfusion.  Resp:  Normal effort.  Abd:  No distention.  MSK:  No deformity noted.  Neuro:  No focal deficits appreciated. Other:     ED Results / Procedures / Treatments   Labs (all labs ordered are listed, but only abnormal results are displayed) Labs Reviewed  URINALYSIS, ROUTINE W REFLEX MICROSCOPIC - Abnormal; Notable for the following components:      Result Value   Color, Urine STRAW (*)    APPearance CLEAR (*)    Glucose, UA >=500 (*)    All other components within normal limits  BASIC METABOLIC PANEL - Abnormal; Notable for the following components:   Sodium 134 (*)    CO2 19 (*)    Glucose, Bld 433 (*)    Creatinine, Ser 1.68 (*)    GFR, Estimated 43 (*)    All other components within normal limits  CBC - Abnormal; Notable for the following components:   WBC 11.9 (*)    All other components within normal limits  CBG MONITORING, ED - Abnormal; Notable  for the following components:   Glucose-Capillary 384 (*)    All other components within normal limits  CBG MONITORING, ED - Abnormal; Notable for the following components:   Glucose-Capillary 325 (*)    All other components within normal limits  CBG MONITORING, ED  TROPONIN I (HIGH SENSITIVITY)  TROPONIN I (HIGH SENSITIVITY)    EKG Sinus rhythm with a rate of 101 bpm.  Normal axis and intervals.  No acute signs of acute ischemia.  RADIOLOGY 2 view CXR interpreted by me with streaky bibasilar atelectasis versus infiltrate CT chest interpreted by me without PE  Official radiology report(s): CT Angio Chest PE W and/or Wo Contrast  Result Date: 07/06/2022 CLINICAL DATA:  Chest pain and syncopal episode EXAM: CT ANGIOGRAPHY CHEST WITH CONTRAST TECHNIQUE: Multidetector CT imaging of the chest was performed using the standard protocol during bolus administration of intravenous contrast. Multiplanar CT image reconstructions and MIPs were obtained to evaluate the vascular anatomy.  RADIATION DOSE REDUCTION: This exam was performed according to the departmental dose-optimization program which includes automated exposure control, adjustment of the mA and/or kV according to patient size and/or use of iterative reconstruction technique. CONTRAST:  75mL OMNIPAQUE IOHEXOL 350 MG/ML SOLN COMPARISON:  Chest x-ray from the previous day. FINDINGS: Cardiovascular: No cardiac enlargement is noted. The thoracic aorta shows no aneurysmal dilatation or dissection. Coronary calcifications are seen. The pulmonary artery shows a normal branching pattern bilaterally. No filling defect to suggest pulmonary embolism is noted. Mediastinum/Nodes: Thoracic inlet is within normal limits. No hilar or mediastinal adenopathy is noted. The esophagus is dilated consistent with the known history of achalasia. This extends to the level of the gastroesophageal junction. Lungs/Pleura: Mild bibasilar atelectatic changes are noted. No sizable effusion is seen. No parenchymal nodule is noted. Upper Abdomen: Gallbladder has been surgically removed. The remainder of the upper abdomen appears within normal limits. Musculoskeletal: No chest wall abnormality. No acute or significant osseous findings. Review of the MIP images confirms the above findings. IMPRESSION: No evidence of pulmonary emboli. Changes consistent with achalasia. Mild bibasilar atelectasis. Electronically Signed   By: Alcide Clever M.D.   On: 07/06/2022 01:43   DG Chest 2 View  Result Date: 07/05/2022 CLINICAL DATA:  Syncope. EXAM: CHEST - 2 VIEW COMPARISON:  March 14, 2021 FINDINGS: Normal cardiac silhouette. Calcific atherosclerotic disease and tortuosity of the aorta. Bilateral lower lobe atelectasis versus streaky airspace consolidation. IMPRESSION: Bilateral lower lobe atelectasis versus streaky airspace consolidation. Electronically Signed   By: Ted Mcalpine M.D.   On: 07/05/2022 20:09    PROCEDURES and INTERVENTIONS:  .1-3 Lead EKG  Interpretation  Performed by: Delton Prairie, MD Authorized by: Delton Prairie, MD     Interpretation: normal     ECG rate:  70   ECG rate assessment: normal     Rhythm: sinus rhythm     Ectopy: none     Conduction: normal     Medications  lactated ringers bolus 1,000 mL (0 mLs Intravenous Stopped 07/06/22 0203)  iohexol (OMNIPAQUE) 350 MG/ML injection 75 mL (75 mLs Intravenous Contrast Given 07/06/22 0132)  insulin aspart (novoLOG) injection 10 Units (10 Units Intravenous Given 07/06/22 0306)     IMPRESSION / MDM / ASSESSMENT AND PLAN / ED COURSE  I reviewed the triage vital signs and the nursing notes.  Differential diagnosis includes, but is not limited to, ACS, PTX, PNA, muscle strain/spasm, PE, dissection, anxiety, pleural effusion  {Patient presents with symptoms of an acute illness or injury that is potentially  life-threatening.  Pleasant 72 year old male who has not been compliant with his diabetic medications presents after a spell of dizziness in the shower without syncope, possibly related to his hyperglycemia but ultimately suitable for outpatient management.  He looks well.  Generally reassuring workup with 2 negative troponins, negative CTA chest.  Hyperglycemic without signs of DKA.  Urine without infectious features.  Open ambulatory at his baseline and feeling better after improvement of his glucose.  I wrote prescriptions/refills for his metformin and Tradjenta.  We discussed return precautions.  Patient suitable for outpatient management.  Clinical Course as of 07/06/22 0605  Sun Jul 06, 2022  0300 Reassessed.  Reports feeling "a little bit better."  CBG improving with fluids, we discussed a small dose of IV insulin.  We discussed reassuring CT.  Discussed possible etiologies of his symptoms. [DS]  0359 Reassessed.  Improving blood glucose levels.  He reports feeling better.  He gets up and ambulates to the restroom and reports feeling well [DS]    Clinical Course User  Index [DS] Delton Prairie, MD     FINAL CLINICAL IMPRESSION(S) / ED DIAGNOSES   Final diagnoses:  Hyperglycemia     Rx / DC Orders   ED Discharge Orders          Ordered    linagliptin (TRADJENTA) 5 MG TABS tablet  Daily        07/06/22 0401    metFORMIN (GLUCOPHAGE) 500 MG tablet  2 times daily with meals        07/06/22 0401             Note:  This document was prepared using Dragon voice recognition software and may include unintentional dictation errors.   Delton Prairie, MD 07/06/22 [phone]

## 2022-07-05 NOTE — ED Triage Notes (Signed)
Pt states that he was taking a shower this evening and passed out. After syncopal episode pt states that he is "not feeling right in the chest". Pt reports increased fatigue x2 weeks.

## 2022-07-06 ENCOUNTER — Emergency Department: Payer: Medicare HMO

## 2022-07-06 LAB — TROPONIN I (HIGH SENSITIVITY): Troponin I (High Sensitivity): 4 ng/L (ref <18)

## 2022-07-06 LAB — URINALYSIS, ROUTINE W REFLEX MICROSCOPIC
Bacteria, UA: NONE SEEN
Bilirubin Urine: NEGATIVE
Glucose, UA: >=500 mg/dL — AB
Hgb urine dipstick: NEGATIVE
Ketones, ur: NEGATIVE mg/dL
Leukocytes,Ua: NEGATIVE
Nitrite: NEGATIVE
Protein, ur: NEGATIVE mg/dL
Specific Gravity, Urine: 1.017 (ref 1.005–1.030)
Squamous Epithelial / HPF: NONE SEEN /HPF (ref 0–5)
pH: 5 (ref 5.0–8.0)

## 2022-07-06 LAB — CBG MONITORING, ED
Glucose-Capillary: 384 mg/dL — ABNORMAL HIGH (ref 70–99)
Glucose-Capillary: 325 mg/dL — ABNORMAL HIGH (ref 70–99)

## 2022-07-06 MED ORDER — INSULIN ASPART 100 UNIT/ML IJ SOLN
10.0000 [IU] | Freq: Once | INTRAMUSCULAR | Status: AC
  Administered 2022-07-06: 10 [IU] via INTRAVENOUS
  Filled 2022-07-06: qty 1

## 2022-07-06 MED ORDER — METFORMIN HCL 500 MG PO TABS: 500.0000 mg | ORAL_TABLET | Freq: Two times a day (BID) | ORAL | 1 refills | Status: AC

## 2022-07-06 MED ORDER — LINAGLIPTIN 5 MG PO TABS: 5.0000 mg | ORAL_TABLET | Freq: Every day | ORAL | 1 refills | Status: AC

## 2022-07-06 MED ORDER — LACTATED RINGERS IV BOLUS
1000.0000 mL | Freq: Once | INTRAVENOUS | Status: AC
  Administered 2022-07-06: 1000 mL via INTRAVENOUS

## 2022-07-06 MED ORDER — IOHEXOL 350 MG/ML SOLN
75.0000 mL | Freq: Once | INTRAVENOUS | Status: AC | PRN
  Administered 2022-07-06: 75 mL via INTRAVENOUS

## 2022-07-06 NOTE — Discharge Instructions (Addendum)
Please pick up the refills of your diabetic medications and continue taking his medications every day as prescribed.

## 2022-08-12 ENCOUNTER — Observation Stay
Admission: EM | Admit: 2022-08-12 | Discharge: 2022-08-13 | Disposition: A | Discharge status: Home / Self Care | Payer: Medicare HMO | Attending: Internal Medicine | Admitting: Internal Medicine

## 2022-08-12 ENCOUNTER — Emergency Department: Payer: Medicare HMO

## 2022-08-12 ENCOUNTER — Other Ambulatory Visit: Payer: Self-pay

## 2022-08-12 DIAGNOSIS — E119 Type 2 diabetes mellitus without complications: Secondary | ICD-10-CM

## 2022-08-12 DIAGNOSIS — K5900 Constipation, unspecified: Secondary | ICD-10-CM | POA: Diagnosis not present

## 2022-08-12 DIAGNOSIS — K922 Gastrointestinal hemorrhage, unspecified: Secondary | ICD-10-CM | POA: Diagnosis not present

## 2022-08-12 DIAGNOSIS — K921 Melena: Secondary | ICD-10-CM | POA: Diagnosis present

## 2022-08-12 LAB — COMPREHENSIVE METABOLIC PANEL
ALT: 22 U/L (ref 0–44)
AST: 23 U/L (ref 15–41)
Albumin: 3.9 g/dL (ref 3.5–5.0)
Alkaline Phosphatase: 65 U/L (ref 38–126)
Anion gap: 8 (ref 5–15)
BUN: 16 mg/dL (ref 8–23)
CO2: 23 mmol/L (ref 22–32)
Calcium: 9.5 mg/dL (ref 8.9–10.3)
Chloride: 103 mmol/L (ref 98–111)
Creatinine, Ser: 1.6 mg/dL — ABNORMAL HIGH (ref 0.61–1.24)
GFR, Estimated: 46 mL/min — ABNORMAL LOW (ref >60)
Glucose, Bld: 233 mg/dL — ABNORMAL HIGH (ref 70–99)
Potassium: 4.1 mmol/L (ref 3.5–5.1)
Sodium: 134 mmol/L — ABNORMAL LOW (ref 135–145)
Total Bilirubin: 0.7 mg/dL (ref 0.3–1.2)
Total Protein: 8.4 g/dL — ABNORMAL HIGH (ref 6.5–8.1)

## 2022-08-12 LAB — TYPE AND SCREEN
ABO/RH(D): O POS
Antibody Screen: NEGATIVE

## 2022-08-12 LAB — PROTIME-INR
INR: 1.1 (ref 0.8–1.2)
Prothrombin Time: 13.9 seconds (ref 11.4–15.2)

## 2022-08-12 LAB — CBG MONITORING, ED: Glucose-Capillary: 202 mg/dL — ABNORMAL HIGH (ref 70–99)

## 2022-08-12 LAB — CBC
HCT: 41.2 % (ref 39.0–52.0)
Hemoglobin: 13.9 g/dL (ref 13.0–17.0)
MCH: 30.4 pg (ref 26.0–34.0)
MCHC: 33.7 g/dL (ref 30.0–36.0)
MCV: 90.2 fL (ref 80.0–100.0)
Platelets: 242 10*3/uL (ref 150–400)
RBC: 4.57 MIL/uL (ref 4.22–5.81)
RDW: 12.5 % (ref 11.5–15.5)
WBC: 11.2 10*3/uL — ABNORMAL HIGH (ref 4.0–10.5)
nRBC: 0 % (ref 0.0–0.2)

## 2022-08-12 LAB — LACTIC ACID, PLASMA: Lactic Acid, Venous: 1.3 mmol/L (ref 0.5–1.9)

## 2022-08-12 LAB — APTT: aPTT: 32 seconds (ref 24–36)

## 2022-08-12 MED ORDER — IOHEXOL 350 MG/ML SOLN
100.0000 mL | Freq: Once | INTRAVENOUS | Status: AC | PRN
  Administered 2022-08-12: 100 mL via INTRAVENOUS

## 2022-08-12 MED ORDER — POLYETHYLENE GLYCOL 3350 17 G PO PACK
17.0000 g | PACK | Freq: Two times a day (BID) | ORAL | Status: DC
  Administered 2022-08-12: 17 g via ORAL
  Filled 2022-08-12 (×2): qty 1

## 2022-08-12 MED ORDER — PANTOPRAZOLE INFUSION (NEW) - SIMPLE MED
8.0000 mg/h | INTRAVENOUS | Status: DC
  Administered 2022-08-12: 8 mg/h via INTRAVENOUS
  Filled 2022-08-12: qty 100

## 2022-08-12 MED ORDER — INSULIN ASPART 100 UNIT/ML IJ SOLN
0.0000 [IU] | Freq: Every day | INTRAMUSCULAR | Status: DC
  Administered 2022-08-12: 2 [IU] via SUBCUTANEOUS
  Filled 2022-08-12: qty 1

## 2022-08-12 MED ORDER — PANTOPRAZOLE 80MG IVPB - SIMPLE MED
80.0000 mg | Freq: Once | INTRAVENOUS | Status: AC
  Administered 2022-08-12: 80 mg via INTRAVENOUS
  Filled 2022-08-12: qty 100

## 2022-08-12 MED ORDER — DOCUSATE SODIUM 100 MG PO CAPS: 100.0000 mg | ORAL_CAPSULE | Freq: Two times a day (BID) | ORAL | Status: DC | PRN

## 2022-08-12 MED ORDER — PANTOPRAZOLE SODIUM 40 MG IV SOLR: 40.0000 mg | Freq: Two times a day (BID) | INTRAVENOUS | Status: DC

## 2022-08-12 MED ORDER — INSULIN ASPART 100 UNIT/ML IJ SOLN
0.0000 [IU] | Freq: Three times a day (TID) | INTRAMUSCULAR | Status: DC
  Administered 2022-08-13: 5 [IU] via SUBCUTANEOUS
  Administered 2022-08-13: 3 [IU] via SUBCUTANEOUS
  Filled 2022-08-12 (×2): qty 1

## 2022-08-12 NOTE — ED Provider Notes (Signed)
[facility] Provider Note    Event Date/Time   First MD Initiated Contact with Patient 08/12/22 1829     (approximate)   History   Rectal Bleeding   HPI  Andrew DELOE Sr. is a 72 y.o. male  with PMHx HLD, DM, gastric metaplasia, here with hyperglycemia and blood in stool. He says that over the past few days his BG has been very elevated. Has been more fatigued, had "brain fog" with it. No known triggers, has been taking his meds as prescribed.  Since Thursday, has been having dark blood in his stool. Always there, occasionally bright red when he strains hard. No hematochezia. H/o reflux. No NSAIDs. No other sx - mild nausea but no vomiting.    Physical Exam   Triage Vital Signs: ED Triage Vitals  Enc Vitals Group     BP 08/12/22 1825 95/78     Pulse Rate 08/12/22 1825 96     Resp 08/12/22 1825 17     Temp 08/12/22 1825 98.1 F (36.7 C)     Temp src --      SpO2 08/12/22 1825 99 %     Weight --      Height --      Head Circumference --      Peak Flow --      Pain Score 08/12/22 1826 5     Pain Loc --      Pain Edu? --      Excl. in GC? --     Most recent vital signs: Vitals:   08/12/22 2300 08/13/22 0000  BP: 116/60 102/68  Pulse:  75  Resp: 17 20  Temp:    SpO2:  97%     General: Awake, no distress.  CV:  Good peripheral perfusion. RRR. Resp:  Normal work of breathing. Lungs clear to auscultation bilaterally. Abd:  No distention. No tenderness. No rebound or guarding. Other:  Dry MM.   ED Results / Procedures / Treatments   Labs (all labs ordered are listed, but only abnormal results are displayed) Labs Reviewed  COMPREHENSIVE METABOLIC PANEL - Abnormal; Notable for the following components:      Result Value   Sodium 134 (*)    Glucose, Bld 233 (*)    Creatinine, Ser 1.60 (*)    Total Protein 8.4 (*)    GFR, Estimated 46 (*)    All other components within normal limits  CBC - Abnormal; Notable for the  following components:   WBC 11.2 (*)    All other components within normal limits  URINALYSIS, ROUTINE W REFLEX MICROSCOPIC - Abnormal; Notable for the following components:   Color, Urine STRAW (*)    APPearance CLEAR (*)    Specific Gravity, Urine 1.040 (*)    Glucose, UA 150 (*)    All other components within normal limits  CBG MONITORING, ED - Abnormal; Notable for the following components:   Glucose-Capillary 202 (*)    All other components within normal limits  PROTIME-INR  LACTIC ACID, PLASMA  APTT  LACTIC ACID, PLASMA  HEMOGLOBIN A1C  THYROID PANEL WITH TSH  CBC  CBC  BASIC METABOLIC PANEL  POC OCCULT BLOOD, ED  TYPE AND SCREEN     EKG Normal sinus rhythm, VR 84. PR 189, QRS 89, QTc 445. No acute St elevations. No ischemia or infarct.   RADIOLOGY    I also independently reviewed and agree with radiologist interpretations.   PROCEDURES:  Critical  Care performed: No   MEDICATIONS ORDERED IN ED: Medications  sodium chloride flush (NS) 0.9 % injection 3 mL (has no administration in time range)  0.9 % NaCl with KCl 20 mEq/ L  infusion (has no administration in time range)  insulin aspart (novoLOG) injection 0-15 Units (has no administration in time range)  insulin aspart (novoLOG) injection 0-5 Units (2 Units Subcutaneous Given 08/12/22 2157)  polyethylene glycol (MIRALAX / GLYCOLAX) packet 17 g (17 g Oral Given 08/12/22 2302)  docusate sodium (COLACE) capsule 100 mg (has no administration in time range)  linagliptin (TRADJENTA) tablet 5 mg (has no administration in time range)  pravastatin (PRAVACHOL) tablet 40 mg (has no administration in time range)  cholecalciferol (VITAMIN D3) 25 MCG (1000 UNIT) tablet 1,000 Units (has no administration in time range)  pantoprazole (PROTONIX) 80 mg /NS 100 mL IVPB (0 mg Intravenous Stopped 08/12/22 2035)  iohexol (OMNIPAQUE) 350 MG/ML injection 100 mL (100 mLs Intravenous Contrast Given 08/12/22 2037)     IMPRESSION / MDM /  ASSESSMENT AND PLAN / ED COURSE  I reviewed the triage vital signs and the nursing notes.                              Differential diagnosis includes, but is not limited to, gastritis, GERD, PUD, diverticulosis, AVM, hemorrhoids  Patient's presentation is most consistent with acute presentation with potential threat to life or bodily function.  The patient is on the cardiac monitor to evaluate for evidence of arrhythmia and/or significant heart rate changes   72 yo M with PMHx HLD, DM, gastric metaplasia, here with hyperglycemia and blood in stool. Re: blood in stool - grossly bloody stool noted in rectal vault w/ melena. No hematochezia, no hemodynamic instability. H/o gastric metaplasia, gastritis. Will start on protonix bolus and drip. He is HDS. CT ordered. Labs o/w reassuring. Glu slightly elevated bu no signs of DKA.    FINAL CLINICAL IMPRESSION(S) / ED DIAGNOSES   Final diagnoses:  Gastrointestinal hemorrhage, unspecified gastrointestinal hemorrhage type     Rx / DC Orders   ED Discharge Orders     None        Note:  This document was prepared using Dragon voice recognition software and may include unintentional dictation errors.   Shaune Pollack, MD 08/13/22 [phone]

## 2022-08-12 NOTE — ED Triage Notes (Addendum)
Pt comes with c/o back of head pain. Pt states also having some rectal bleeding. Pt states the blood in stool started las Thursday. Pt states dark colored stool.  Pt states the head pain could be from the heavy lifting bc they recently moved.   Pt also states today he was in shower and felt like he was going to pass out.

## 2022-08-12 NOTE — Assessment & Plan Note (Signed)
New.  Encourage p.o. hydration.  Check TSH.  Electrolytes within normal limits

## 2022-08-12 NOTE — Assessment & Plan Note (Addendum)
Going on for about 5 days.  Does not seem to be an exsanguinating bleed.  Lower GI based on history and vital stability.  Also finding of bright red blood per rectum with vital stability most consistent with lower GI bleed.  Follow-up PTT.  Hold aspirin.  And treat with laxative given association with constipation.  In spite of some leukocytosis, no finding of infection on CAT scan abdomen, no active bleeding visualized there.  Concern here is hemoroidal bleeding given overall clinical picture. Overall plan would be for the bleeding to remit with laxatives and cessation of aspirin temporarily.  With plan for routine colonoscopy.  I will start the patient on clear liquid diet with plan to advance as tolerated.  Discussed with patient and wife at the bedside. Empiric pantop started in ER. Can now be stopped as upper gi bleeding is unlikely.

## 2022-08-12 NOTE — ED Notes (Signed)
Patient to CT.

## 2022-08-12 NOTE — ED Notes (Signed)
Called to lab asking for LA progress and per Clydie Braun in lab they do not have a specimen for a LA on this patient. I informed all his labs were tubed together and she stated she can not locate it and it will need to be redrawn. MD Maryjean Ka notified.

## 2022-08-12 NOTE — H&P (Addendum)
History and Physical    Patient: Andrew Cantrell ZOX:[id number] DOB: 07-03-1950 DOA: 08/12/2022 DOS: the patient was seen and examined on 08/12/2022 PCP: Leanna Sato, MD  Patient coming from: Home  Chief Complaint:  Chief Complaint  Patient presents with   Rectal Bleeding   HPI: Andrew EILTS Sr. is a 72 y.o. male with medical history significant of episode of hematochezia about a year ago.  That self resolved with laxative use.  Patient has since then not had any bleeding per rectum or any melena.  Patient was in his usual state of health till about 8 or 9 days ago when he had a change in his bowel habits which became more constipated.  Patient was now having a harder stools and longer.  Still having 1-2 episodes a day.  Patient does not report any change in medications or hydration habits in association with the change in bowel habits.  No vomiting no abdominal pain.  Starting about 5 days ago patient started having streaks of dark red versus bright red blood on top of light brown stools which were still hard and long and formed.  There were 1-2 episodes a day. The blood would also stain the water in the bowl. And occsionaly be present on the wipe.  There was no other site of bleeding such as gum bleeding nosebleeding skin bruising.  Patient reports no new chest pain shortness of breath presyncope.  The bright red blood per rectum has continued for the last 5 days.  Patient finally called PCP today who advised the patient to come to the ER.  Patient at this time offers no complaints besides slight headache which has been going on for several months on and off. Review of Systems: As mentioned in the history of present illness. All other systems reviewed and are negative. Past Medical History:  Diagnosis Date   Achalasia    Diabetes mellitus without complication (HCC)    Gout    Hypercholesteremia    Hypertension    Wears hearing aid in right ear    Past Surgical  History:  Procedure Laterality Date   CHOLECYSTECTOMY     COLONOSCOPY WITH PROPOFOL N/A 11/22/2020   Procedure: COLONOSCOPY WITH PROPOFOL;  Surgeon: Toney Reil, MD;  Location: [facility];  Service: Endoscopy;  Laterality: N/A;   ESOPHAGOGASTRODUODENOSCOPY (EGD) WITH PROPOFOL N/A 08/18/2017   Procedure: ESOPHAGOGASTRODUODENOSCOPY (EGD) WITH PROPOFOL;  Surgeon: Christena Deem, MD;  Location[facility];  Service: Endoscopy;  Laterality: N/A;   ESOPHAGOGASTRODUODENOSCOPY (EGD) WITH PROPOFOL N/A 11/22/2020   Procedure: ESOPHAGOGASTRODUODENOSCOPY (EGD) WITH PROPOFOL;  Surgeon: Toney Reil, MD;  Location: [facility];  Service: Endoscopy;  Laterality: N/A;  Diabetic   ESOPHAGOGASTRODUODENOSCOPY (EGD) WITH PROPOFOL N/A 12/10/2020   Procedure: ESOPHAGOGASTRODUODENOSCOPY (EGD) WITH PROPOFOL;  Surgeon: Toney Reil, MD;  Location: [facility] ENDOSCOPY;  Service: Gastroenterology;  Laterality: N/A;   HERNIA REPAIR     Social History:  reports that he has quit smoking. He has never used smokeless tobacco. He reports that he does not currently use alcohol. He reports that he does not use drugs.  Allergies  Allergen Reactions   Acetaminophen Other (See Comments)    "Gives me a rush in my chest"   Morphine And Codeine Other (See Comments)    "Felt like I was having a heart attack"    No family history on file.  Prior to Admission medications   Medication Sig Start Date End Date Taking? Authorizing Provider  aspirin EC 81 MG tablet Take 81 mg by mouth daily.   Yes [provider]  Cholecalciferol (VITAMIN D3 PO) Take by mouth.   Yes [provider]  linagliptin (TRADJENTA) 5 MG TABS tablet Take 1 tablet (5 mg total) by mouth daily. 07/06/22  Yes Delton Prairie, MD  lovastatin (MEVACOR) 40 MG tablet  02/12/20  Yes [provider]  metFORMIN (GLUCOPHAGE) 500 MG tablet Take 1 tablet (500 mg total) by mouth 2 (two) times daily with a meal.  07/06/22 09/04/22 Yes Delton Prairie, MD  albuterol (VENTOLIN HFA) 108 (90 Base) MCG/ACT inhaler Inhale 2 puffs into the lungs every 6 (six) hours as needed for wheezing or shortness of breath. Patient not taking: Reported on 08/12/2022 03/14/21   Orvil Feil, PA-C  cetirizine (ZYRTEC ALLERGY) 10 MG tablet Take 1 tablet (10 mg total) by mouth daily for 7 days. Patient not taking: Reported on 08/12/2022 03/14/21 03/21/21  Orvil Feil, PA-C    Physical Exam: Vitals:   08/12/22 1825 08/12/22 1900 08/12/22 2002 08/12/22 2007  BP: 95/78 117/67 108/68   Pulse: 96 82 78   Resp: 17 (!) 22 20   Temp: 98.1 F (36.7 C)     SpO2: 99% 98% 97%   Weight:    71.2 kg  Height:    5\' 3"  (1.6 m)   General: Patient does not appear to be in any distress is alert awake coherent Respiratory exam: Bilateral intravesicular Cardiovascular exam S1-S2 normal Abdomen soft nontender normal bowel sounds\ Extremities warm without edema Rectal exam was done by ER provider.  Per report there was bright red blood per her rectum.  Data Reviewed:  Labs on Admission:  Results for orders placed or performed during the hospital encounter of 08/12/22 (from the past 24 hour(s))  Comprehensive metabolic panel     Status: Abnormal   Collection Time: 08/12/22  6:40 PM  Result Value Ref Range   Sodium 134 (L) 135 - 145 mmol/L   Potassium 4.1 3.5 - 5.1 mmol/L   Chloride 103 98 - 111 mmol/L   CO2 23 22 - 32 mmol/L   Glucose, Bld 233 (H) 70 - 99 mg/dL   BUN 16 8 - 23 mg/dL   Creatinine, Ser 7.82 (H) 0.61 - 1.24 mg/dL   Calcium 9.5 8.9 - 95.6 mg/dL   Total Protein 8.4 (H) 6.5 - 8.1 g/dL   Albumin 3.9 3.5 - 5.0 g/dL   AST 23 15 - 41 U/L   ALT 22 0 - 44 U/L   Alkaline Phosphatase 65 38 - 126 U/L   Total Bilirubin 0.7 0.3 - 1.2 mg/dL   GFR, Estimated 46 (L) >60 mL/min   Anion gap 8 5 - 15  CBC     Status: Abnormal   Collection Time: 08/12/22  6:40 PM  Result Value Ref Range   WBC 11.2 (H) 4.0 - 10.5 K/uL   RBC 4.57 4.22 -  5.81 MIL/uL   Hemoglobin 13.9 13.0 - 17.0 g/dL   HCT 21.3 08.6 - 57.8 %   MCV 90.2 80.0 - 100.0 fL   MCH 30.4 26.0 - 34.0 pg   MCHC 33.7 30.0 - 36.0 g/dL   RDW 46.9 62.9 - 52.8 %   Platelets 242 150 - 400 K/uL   nRBC 0.0 0.0 - 0.2 %  Protime-INR     Status: None   Collection Time: 08/12/22  6:48 PM  Result Value Ref Range   Prothrombin Time 13.9 11.4 -  15.2 seconds   INR 1.1 0.8 - 1.2  APTT     Status: None   Collection Time: 08/12/22  6:48 PM  Result Value Ref Range   aPTT 32 24 - 36 seconds  Type and screen     Status: None   Collection Time: 08/12/22  7:50 PM  Result Value Ref Range   ABO/RH(D) O POS    Antibody Screen NEG    Sample Expiration      08/15/2022,2359 Performed at [facility], [address] [place], [place] [address]   Lactic acid, plasma     Status: None   Collection Time: 08/12/22  9:58 PM  Result Value Ref Range   Lactic Acid, Venous 1.3 0.5 - 1.9 mmol/L   Basic Metabolic Panel: Recent Labs  Lab 08/12/22 1840  NA 134*  K 4.1  CL 103  CO2 23  GLUCOSE 233*  BUN 16  CREATININE 1.60*  CALCIUM 9.5   Liver Function Tests: Recent Labs  Lab 08/12/22 1840  AST 23  ALT 22  ALKPHOS 65  BILITOT 0.7  PROT 8.4*  ALBUMIN 3.9   No results for input(s): "LIPASE", "AMYLASE" in the last 168 hours. No results for input(s): "AMMONIA" in the last 168 hours. CBC: Recent Labs  Lab 08/12/22 1840  WBC 11.2*  HGB 13.9  HCT 41.2  MCV 90.2  PLT 242   Cardiac Enzymes: No results for input(s): "CKTOTAL", "CKMB", "CKMBINDEX", "TROPONINIHS" in the last 168 hours.  BNP (last 3 results) No results for input(s): "PROBNP" in the last 8760 hours. CBG: No results for input(s): "GLUCAP" in the last 168 hours.  Radiological Exams on Admission:  CT ANGIO GI BLEED  Result Date: 08/12/2022 CLINICAL DATA:  Mesenteric ischemia, acute abdominal discomfort, bleeding from rectum. EXAM: CTA ABDOMEN AND PELVIS WITHOUT AND WITH CONTRAST TECHNIQUE:  Multidetector CT imaging of the abdomen and pelvis was performed using the standard protocol during bolus administration of intravenous contrast. Multiplanar reconstructed images and MIPs were obtained and reviewed to evaluate the vascular anatomy. RADIATION DOSE REDUCTION: This exam was performed according to the departmental dose-optimization program which includes automated exposure control, adjustment of the mA and/or kV according to patient size and/or use of iterative reconstruction technique. CONTRAST:  OMNIPAQUE IOHEXOL 350 MG/ML SOLN COMPARISON:  CT abdomen and pelvis 10/14/2012 FINDINGS: VASCULAR Aorta: Calcified atherosclerotic plaque without narrowing or occlusion. No aneurysm or dissection. Celiac: Patent without evidence of aneurysm, dissection, vasculitis or significant stenosis. SMA: Patent without evidence of aneurysm, dissection, vasculitis or significant stenosis. Renals: Both renal arteries are patent without evidence of aneurysm, dissection, vasculitis, fibromuscular dysplasia or significant stenosis. IMA: Patent without evidence of aneurysm, dissection, vasculitis or significant stenosis. Inflow: Calcified atherosclerotic plaque without significant narrowing. No aneurysm or dissection. Proximal Outflow: Bilateral common femoral and visualized portions of the superficial and profunda femoral arteries are patent without evidence of aneurysm, dissection, vasculitis or significant stenosis. Veins: The IVC and portal veins are patent. Review of the MIP images confirms the above findings. NON-VASCULAR Lower chest: Bibasilar atelectasis/scarring. Lower lobe bronchiolectasis. No acute abnormality. Hepatobiliary: Cholecystectomy. Hepatic steatosis. No biliary dilation. Pancreas: Unremarkable. Spleen: Unremarkable. Adrenals/Urinary Tract: Normal adrenal glands. Bilateral cortical renal scarring. No urinary calculi or hydronephrosis. Unremarkable bladder. Stomach/Bowel: Normal caliber large and  small bowel. Colonic diverticulosis without diverticulitis. No evidence of active GI bleeding. Patulous lower esophagus containing food and frothy debris. Stomach is otherwise within normal limits. Lymphatic: No lymphadenopathy. Reproductive: Enlarged prostate. Other: No free intraperitoneal fluid or  air. Musculoskeletal: No acute osseous abnormality. IMPRESSION: 1. No evidence of active GI bleeding. 2. No acute abdominopelvic abnormality. 3. Colonic diverticulosis without diverticulitis. 4. Hepatic steatosis. Aortic Atherosclerosis (ICD10-I70.0). Electronically Signed   By: Minerva Fester M.D.   On: 08/12/2022 21:09    EKG: Independently reviewed. NSR   Assessment and Plan: * Hematochezia Going on for about 5 days.  Does not seem to be an exsanguinating bleed.  Lower GI based on history and vital stability.  Also finding of bright red blood per rectum with vital stability most consistent with lower GI bleed.  Follow-up PTT.  Hold aspirin.  And treat with laxative given association with constipation.  In spite of some leukocytosis, no finding of infection on CAT scan abdomen, no active bleeding visualized there.  Concern here is hemoroidal bleeding given overall clinical picture. Overall plan would be for the bleeding to remit with laxatives and cessation of aspirin temporarily.  With plan for routine colonoscopy.  I will start the patient on clear liquid diet with plan to advance as tolerated.  Discussed with patient and wife at the bedside. Empiric pantop started in ER. Can now be stopped as upper gi bleeding is unlikely.  Diabetes mellitus (HCC) Chornic.  Continue with lovastatin, hold metformin.  Patient was started on aspirin for primary cardiovascular prophylaxis based on his diabetes.  Aspirin will be held to help remit the lower GI bleed  Constipation New.  Encourage p.o. hydration.  Check TSH.  Electrolytes within normal limits      Advance Care Planning:   Code Status: Full Code    Consults: if any concern for exsanguinating bleedign, will discuss with VIR. Patient will need routine colonoscopy/anoscopy. Last colonoscopy was about 5 years ago per patient.  Family Communication: wife at bedside.  Severity of Illness: The appropriate patient status for this patient is OBSERVATION. Observation status is judged to be reasonable and necessary in order to provide the required intensity of service to ensure the patient's safety. The patient's presenting symptoms, physical exam findings, and initial radiographic and laboratory data in the context of their medical condition is felt to place them at decreased risk for further clinical deterioration. Furthermore, it is anticipated that the patient will be medically stable for discharge from the hospital within 2 midnights of admission.   Author: Nolberto Hanlon, MD 08/12/2022 10:28 PM  For on call review [email or web address].

## 2022-08-12 NOTE — Assessment & Plan Note (Signed)
Chornic.  Continue with lovastatin, hold metformin.  Patient was started on aspirin for primary cardiovascular prophylaxis based on his diabetes.  Aspirin will be held to help remit the lower GI bleed

## 2022-08-13 LAB — BASIC METABOLIC PANEL
Anion gap: 6 (ref 5–15)
BUN: 14 mg/dL (ref 8–23)
CO2: 24 mmol/L (ref 22–32)
Calcium: 8.9 mg/dL (ref 8.9–10.3)
Chloride: 108 mmol/L (ref 98–111)
Creatinine, Ser: 1.38 mg/dL — ABNORMAL HIGH (ref 0.61–1.24)
GFR, Estimated: 55 mL/min — ABNORMAL LOW (ref >60)
Glucose, Bld: 172 mg/dL — ABNORMAL HIGH (ref 70–99)
Potassium: 4.2 mmol/L (ref 3.5–5.1)
Sodium: 138 mmol/L (ref 135–145)

## 2022-08-13 LAB — URINALYSIS, ROUTINE W REFLEX MICROSCOPIC
Bilirubin Urine: NEGATIVE
Glucose, UA: 150 mg/dL — AB
Hgb urine dipstick: NEGATIVE
Ketones, ur: NEGATIVE mg/dL
Leukocytes,Ua: NEGATIVE
Nitrite: NEGATIVE
Protein, ur: NEGATIVE mg/dL
Specific Gravity, Urine: 1.04 — ABNORMAL HIGH (ref 1.005–1.030)
pH: 5 (ref 5.0–8.0)

## 2022-08-13 LAB — CBC
HCT: 37.4 % — ABNORMAL LOW (ref 39.0–52.0)
Hemoglobin: 12.5 g/dL — ABNORMAL LOW (ref 13.0–17.0)
MCH: 30.3 pg (ref 26.0–34.0)
MCHC: 33.4 g/dL (ref 30.0–36.0)
MCV: 90.8 fL (ref 80.0–100.0)
Platelets: 207 10*3/uL (ref 150–400)
RBC: 4.12 MIL/uL — ABNORMAL LOW (ref 4.22–5.81)
RDW: 12.5 % (ref 11.5–15.5)
WBC: 9.6 10*3/uL (ref 4.0–10.5)
nRBC: 0 % (ref 0.0–0.2)

## 2022-08-13 LAB — CBG MONITORING, ED
Glucose-Capillary: 183 mg/dL — ABNORMAL HIGH (ref 70–99)
Glucose-Capillary: 208 mg/dL — ABNORMAL HIGH (ref 70–99)

## 2022-08-13 LAB — LACTIC ACID, PLASMA: Lactic Acid, Venous: 1.4 mmol/L (ref 0.5–1.9)

## 2022-08-13 MED ORDER — LINAGLIPTIN 5 MG PO TABS
5.0000 mg | ORAL_TABLET | Freq: Every day | ORAL | Status: DC
  Filled 2022-08-13: qty 1

## 2022-08-13 MED ORDER — POLYETHYLENE GLYCOL 3350 17 G PO PACK: 17.0000 g | PACK | Freq: Two times a day (BID) | ORAL | 0 refills | Status: AC

## 2022-08-13 MED ORDER — PRAVASTATIN SODIUM 20 MG PO TABS: 40.0000 mg | ORAL_TABLET | Freq: Every day | ORAL | Status: DC

## 2022-08-13 MED ORDER — VITAMIN D 25 MCG (1000 UNIT) PO TABS
1000.0000 [IU] | ORAL_TABLET | Freq: Every day | ORAL | Status: DC
  Filled 2022-08-13: qty 1

## 2022-08-13 MED ORDER — SODIUM CHLORIDE 0.9% FLUSH
3.0000 mL | Freq: Two times a day (BID) | INTRAVENOUS | Status: DC
  Administered 2022-08-13 (×2): 3 mL via INTRAVENOUS

## 2022-08-13 MED ORDER — POTASSIUM CHLORIDE IN NACL 20-0.9 MEQ/L-% IV SOLN
INTRAVENOUS | Status: AC
  Filled 2022-08-13: qty 1000

## 2022-08-13 NOTE — Discharge Instructions (Signed)
Please follow up with your PCP within a week to recheck your blood work.  I recommend following up with your GI doctor to discuss earlier colonoscopy as needed

## 2022-08-13 NOTE — Care Management CC44 (Signed)
Condition Code 44 Documentation Completed  Patient Details  Name: Andrew SOISSON Sr. MRN: [id number] Date of Birth: [birth year]   Condition Code 44 given:  Yes Patient signature on Condition Code 44 notice:  Yes Documentation of 2 MD's agreement:  Yes Code 44 added to claim:  Yes    Darolyn Rua, LCSW 08/13/2022, 2:29 PM

## 2022-08-13 NOTE — Care Management Obs Status (Signed)
MEDICARE OBSERVATION STATUS NOTIFICATION   Patient Details  Name: Andrew FEINSTEIN Sr. MRN: [id number] Date of Birth: 23-Nov-1950   Medicare Observation Status Notification Given:  Yes    Darolyn Rua, LCSW 08/13/2022, 2:28 PM

## 2022-08-13 NOTE — Discharge Summary (Signed)
Physician Discharge Summary  Patient: Andrew Cantrell DOB: 1950-10-22   Code Status: Full Code Admit date: 08/12/2022 Discharge date: 08/13/2022 Disposition: Home, No home health services recommended PCP: Andrew Sato, MD  Recommendations for Outpatient Follow-up:  Follow up with PCP within 1-2 weeks Regarding general hospital follow up and preventative care Recommend CBC, metabolic panel Follow up with GI. Discuss possible early colonoscopy   Discharge Diagnoses:  Principal Problem:   Hematochezia Active Problems:   GI bleeding   Constipation   Diabetes mellitus [facility])  Brief Hospital Course Summary:  Andrew JESMER Sr. is a 72 y.o. male with medical history significant of episode of hematochezia about a year ago.  That self resolved with laxative use.  Patient has since then not had any bleeding per rectum or any melena.  Patient was in his usual state of health till about 8 or 9 days ago when he had a change in his bowel habits which became more constipated.  Patient was now having a harder stools and longer.  Still having 1-2 episodes a day.  Patient does not report any change in medications or hydration habits in association with the change in bowel habits.  No vomiting no abdominal pain.  Starting about 5 days ago patient started having streaks of dark red versus bright red blood on top of light brown stools which were still hard and long and formed.  There were 1-2 episodes a day. The blood would also stain the water in the bowl. And occsionaly be present on the wipe.  There was no other site of bleeding such as gum bleeding nosebleeding skin bruising.  Patient reports no new chest pain shortness of breath presyncope.    In the ED, it was found that they had completely normal vital signs.  Significant findings included sodium 134, K+ 4.1, glucose 233, Cr 1.6. WBC 11.2, hgb 13.9. He was type and screened.  CTA chest: no PE. Changes consistent with  achalasia.  CTA GI bleed: negative for acute bleed or abdominopelvic abnormality. Diverticulosis without diverticulitis present as well as hepatic steatosis.     They were initially treated with miralax, pantoprazole.    Patient was admitted to medicine service for further workup and management of GI bleed as outlined in detail below.   08/13/22 -reports no more episodes of blood with stool and no abdominal pain. Hgb essentially stable at 12.5. He states that he had a colonoscopy in the past and that it was "normal" and they told him to repeat in 5 years. He thinks it has been about 5 years.  He was discharged in stable condition. His home aspirin was held. Discuss with PCP when to restart. Recommended closer follow up with GI and discussed return precautions.   Discharge Condition: Good, improved Recommended discharge diet:  high fiber diet  Consultations: None   Procedures/Studies: CTA GI bleed   Allergies as of 08/13/2022       Reactions   Acetaminophen Other (See Comments)   "Gives me a rush in my chest"   Morphine And Codeine Other (See Comments)   "Felt like I was having a heart attack"        Medication List     STOP taking these medications    albuterol 108 (90 Base) MCG/ACT inhaler Commonly known as: VENTOLIN HFA   aspirin EC 81 MG tablet   cetirizine 10 MG tablet Commonly known as: ZyrTEC Allergy       TAKE  these medications    linagliptin 5 MG Tabs tablet Commonly known as: TRADJENTA Take 1 tablet (5 mg total) by mouth daily.   lovastatin 40 MG tablet Commonly known as: MEVACOR   metFORMIN 500 MG tablet Commonly known as: GLUCOPHAGE Take 1 tablet (500 mg total) by mouth 2 (two) times daily with a meal.   polyethylene glycol 17 g packet Commonly known as: MIRALAX / GLYCOLAX Take 17 g by mouth 2 (two) times daily.   VITAMIN D3 PO Take by mouth.        Follow-up Information     Andrew Sato, MD. Schedule an appointment as soon as  possible for a visit in 1 week(s).   Specialty: Family Medicine Contact information: [address] [place] [address] [phone]                 Subjective   Pt reports no abdominal pain. Denies any more bowel movements and no red blood per rectum. Denies nausea or vomiting. Endorses having GI doctor for prior colonoscopy about 5 years ago.   All questions and concerns were addressed at time of discharge.  Objective  Blood pressure 117/63, pulse 74, temperature 98 F (36.7 C), resp. rate 16, height 5\' 3"  (1.6 m), weight 71.2 kg, SpO2 99 %.   General: Pt is alert, awake, not in acute distress Cardiovascular: RRR, S1/S2 +, no rubs, no gallops Respiratory: CTA bilaterally, no wheezing, no rhonchi Abdominal: Soft, NT, ND, bowel sounds + Extremities: no edema, no cyanosis  The results of significant diagnostics from this hospitalization (including imaging, microbiology, ancillary and laboratory) are listed below for reference.   Imaging studies: CT ANGIO GI BLEED  Result Date: 08/12/2022 CLINICAL DATA:  Mesenteric ischemia, acute abdominal discomfort, bleeding from rectum. EXAM: CTA ABDOMEN AND PELVIS WITHOUT AND WITH CONTRAST TECHNIQUE: Multidetector CT imaging of the abdomen and pelvis was performed using the standard protocol during bolus administration of intravenous contrast. Multiplanar reconstructed images and MIPs were obtained and reviewed to evaluate the vascular anatomy. RADIATION DOSE REDUCTION: This exam was performed according to the departmental dose-optimization program which includes automated exposure control, adjustment of the mA and/or kV according to patient size and/or use of iterative reconstruction technique. CONTRAST:  OMNIPAQUE IOHEXOL 350 MG/ML SOLN COMPARISON:  CT abdomen and pelvis 10/14/2012 FINDINGS: VASCULAR Aorta: Calcified atherosclerotic plaque without narrowing or occlusion. No aneurysm or dissection. Celiac: Patent without evidence of  aneurysm, dissection, vasculitis or significant stenosis. SMA: Patent without evidence of aneurysm, dissection, vasculitis or significant stenosis. Renals: Both renal arteries are patent without evidence of aneurysm, dissection, vasculitis, fibromuscular dysplasia or significant stenosis. IMA: Patent without evidence of aneurysm, dissection, vasculitis or significant stenosis. Inflow: Calcified atherosclerotic plaque without significant narrowing. No aneurysm or dissection. Proximal Outflow: Bilateral common femoral and visualized portions of the superficial and profunda femoral arteries are patent without evidence of aneurysm, dissection, vasculitis or significant stenosis. Veins: The IVC and portal veins are patent. Review of the MIP images confirms the above findings. NON-VASCULAR Lower chest: Bibasilar atelectasis/scarring. Lower lobe bronchiolectasis. No acute abnormality. Hepatobiliary: Cholecystectomy. Hepatic steatosis. No biliary dilation. Pancreas: Unremarkable. Spleen: Unremarkable. Adrenals/Urinary Tract: Normal adrenal glands. Bilateral cortical renal scarring. No urinary calculi or hydronephrosis. Unremarkable bladder. Stomach/Bowel: Normal caliber large and small bowel. Colonic diverticulosis without diverticulitis. No evidence of active GI bleeding. Patulous lower esophagus containing food and frothy debris. Stomach is otherwise within normal limits. Lymphatic: No lymphadenopathy. Reproductive: Enlarged prostate. Other: No free intraperitoneal fluid or air. Musculoskeletal: No  acute osseous abnormality. IMPRESSION: 1. No evidence of active GI bleeding. 2. No acute abdominopelvic abnormality. 3. Colonic diverticulosis without diverticulitis. 4. Hepatic steatosis. Aortic Atherosclerosis (ICD10-I70.0). Electronically Signed   By: Minerva Fester M.D.   On: 08/12/2022 21:09    Labs: Basic Metabolic Panel: Recent Labs  Lab 08/12/22 1840 08/13/22 0618  NA 134* 138  K 4.1 4.2  CL 103 108  CO2 23  24  GLUCOSE 233* 172*  BUN 16 14  CREATININE 1.60* 1.38*  CALCIUM 9.5 8.9   CBC: Recent Labs  Lab 08/12/22 1840 08/13/22 0618  WBC 11.2* 9.6  HGB 13.9 12.5*  HCT 41.2 37.4*  MCV 90.2 90.8  PLT 242 207   Microbiology: Results for orders placed or performed during the hospital encounter of 03/14/21  Resp Panel by RT-PCR (Flu A&B, Covid) Nasopharyngeal Swab     Status: Abnormal   Collection Time: 03/14/21  6:40 PM   Specimen: Nasopharyngeal Swab; Nasopharyngeal(NP) swabs in vial transport medium  Result Value Ref Range Status   SARS Coronavirus 2 by RT PCR POSITIVE (A) NEGATIVE Final    Comment: (NOTE) SARS-CoV-2 target nucleic acids are DETECTED.  The SARS-CoV-2 RNA is generally detectable in upper respiratory specimens during the acute phase of infection. Positive results are indicative of the presence of the identified virus, but do not rule out bacterial infection or co-infection with other pathogens not detected by the test. Clinical correlation with patient history and other diagnostic information is necessary to determine patient infection status. The expected result is Negative.  Fact Sheet for Patients: BloggerCourse.com  Fact Sheet for Healthcare Providers: SeriousBroker.it  This test is not yet approved or cleared by the [place] FDA and  has been authorized for detection and/or diagnosis of SARS-CoV-2 by FDA under an Emergency Use Authorization (EUA).  This EUA will remain in effect (meaning this test can be used) for the duration of  the COVID-19 declaration under Section 564(b)(1) of the A ct, 21 U.S.C. section 360bbb-3(b)(1), unless the authorization is terminated or revoked sooner.     Influenza A by PCR NEGATIVE NEGATIVE Final   Influenza B by PCR NEGATIVE NEGATIVE Final    Comment: (NOTE) The Xpert Xpress SARS-CoV-2/FLU/RSV plus assay is intended as an aid in the diagnosis of influenza from  Nasopharyngeal swab specimens and should not be used as a sole basis for treatment. Nasal washings and aspirates are unacceptable for Xpert Xpress SARS-CoV-2/FLU/RSV testing.  Fact Sheet for Patients: BloggerCourse.com  Fact Sheet for Healthcare Providers: SeriousBroker.it  This test is not yet approved or cleared by the [place] FDA and has been authorized for detection and/or diagnosis of SARS-CoV-2 by FDA under an Emergency Use Authorization (EUA). This EUA will remain in effect (meaning this test can be used) for the duration of the COVID-19 declaration under Section 564(b)(1) of the Act, 21 U.S.C. section 360bbb-3(b)(1), unless the authorization is terminated or revoked.  Performed at [facility], [address], [place], [place] [address]    Time coordinating discharge: Over 30 minutes  Leeroy Bock, MD  [facility] 08/13/2022, 1:24 PM

## 2022-08-13 NOTE — ED Notes (Signed)
Patient CBG [id number].

## 2022-08-14 LAB — THYROID PANEL WITH TSH
Free Thyroxine Index: 3.2 (ref 1.2–4.9)
T3 Uptake Ratio: 39 % (ref 24–39)
T4, Total: 8.3 ug/dL (ref 4.5–12.0)
TSH: 4.76 u[IU]/mL — ABNORMAL HIGH (ref 0.450–4.500)

## 2022-08-14 LAB — HEMOGLOBIN A1C
Hgb A1c MFr Bld: 11.7 % — ABNORMAL HIGH (ref 4.8–5.6)
Mean Plasma Glucose: 289 mg/dL

## 2022-09-26 ENCOUNTER — Ambulatory Visit: Payer: Medicare HMO | Admitting: Urology

## 2022-10-21 ENCOUNTER — Other Ambulatory Visit: Payer: Self-pay

## 2022-12-09 ENCOUNTER — Telehealth: Payer: Self-pay | Admitting: Gastroenterology

## 2022-12-09 NOTE — Telephone Encounter (Signed)
Called patient to confirm his appointment for tomorrow. Patient asked what was the office visit for and I informed him that it is Gastrointestinal bleed for his stated that he don't want to come in the office to talk about it he just want the procedure. I advised him that he is coming in because of the bleed that he is having and the patient stated that he is no longer bleeding and he just need the procedure. He had more information and I informed him that I will have to send this message to Dr. Allegra Lai nurse. He would like a call back.

## 2022-12-09 NOTE — Telephone Encounter (Signed)
Returned patient call and he states that he just needs a colonoscopy and does not need a ER follow up. Informed him that before we can schedule a colonoscopy the provider has to see him in the office and document how he is doing. He states that is just to get his money and his insurance money. Informed him no because we are taking care of his health because we are making sure we are not putting you through something you are not ready for. He states well he needs to reschedule the appointment then. He reschedule the appointment to a different date

## 2022-12-10 ENCOUNTER — Ambulatory Visit: Payer: Medicare HMO | Admitting: Gastroenterology

## 2023-01-28 ENCOUNTER — Ambulatory Visit: Payer: Medicare HMO | Admitting: Gastroenterology

## 2023-01-28 ENCOUNTER — Encounter: Payer: Self-pay | Admitting: Gastroenterology

## 2023-01-28 VITALS — BP 118/77 | HR 84 | Temp 97.6°F | Ht 63.0 in | Wt 157.0 lb

## 2023-01-28 DIAGNOSIS — K625 Hemorrhage of anus and rectum: Secondary | ICD-10-CM | POA: Diagnosis not present

## 2023-01-28 DIAGNOSIS — K649 Unspecified hemorrhoids: Secondary | ICD-10-CM

## 2023-01-28 DIAGNOSIS — K644 Residual hemorrhoidal skin tags: Secondary | ICD-10-CM

## 2023-01-28 NOTE — Progress Notes (Unsigned)
Arlyss Repress, MD [address]  [address]  [place], [place] [address]  Main: [phone]  Fax: [phone]    Gastroenterology Consultation  Referring Provider:     Leanna Sato, MD Primary Care Physician:  Leanna Sato, MD Primary Gastroenterologist:  Dr. Arlyss Repress Reason for Consultation:     Hematochezia        HPI:   Andrew SAYANI Sr. is a 72 y.o. male referred by Dr. Marvis Moeller, Doralee Albino, MD  for consultation & management of hematochezia.  Patient reports that for last few months, he has been experiencing intermittent blood in the stool, anywhere from bright red to dark red in color, by itself or mixed with stool.  Patient denies any rectal pain, abdominal pain, nausea or vomiting.  He denies abdominal bloating.  His hemoglobin is normal.  He reports having had a colonoscopy several years ago, he had hernia repair with fundoplication in the past.  He had upper endoscopy in 2019  Patient denies smoking or alcohol use Patient denies family history of colon cancer NSAIDs: None  Antiplts/Anticoagulants/Anti thrombotics: None  GI Procedures:  EGD and colonoscopy 2022 DIAGNOSIS: A. STOMACH, INCISURA; BIOPSY: - CHRONIC GASTRITIS. - NEGATIVE FOR INTESTINAL METAPLASIA, DYSPLASIA AND MALIGNANCY. - IMMUNOSTAIN FOR HELICOBACTER PYLORI WILL BE PERFORMED AND FINDINGS REPORTED IN AN ADDENDUM.  B. STOMACH, ANTRUM, LESSER CURVATURE; BIOPSY: - FOCAL CHRONIC ACTIVE GASTRITIS. - NEGATIVE FOR INTESTINAL METAPLASIA, DYSPLASIA AND MALIGNANCY. - IMMUNOSTAIN FOR HELICOBACTER PYLORI WILL BE PERFORMED AND THE FINDINGS REPORTED IN AN ADDENDUM.  C. STOMACH, ANTRUM, GREATER CURVATURE; BIOPSY: - MILD CHRONIC GASTRITIS. - NEGATIVE FOR INTESTINAL METAPLASIA, DYSPLASIA AND MALIGNANCY.  D. STOMACH, BODY, GREATER CURVATURE; BIOPSY: - MILD CHRONIC GASTRITIS. - NEGATIVE FOR INTESTINAL METAPLASIA, DYSPLASIA AND MALIGNANCY.  E. STOMACH, BODY, LESSER CURVATURE; BIOPSY: - MINUTE  FOCUS OF INTESTINAL METAPLASIA. - MILD CHRONIC GASTRITIS. - NEGATIVE FOR DYSPLASIA AND MALIGNANCY.  - Normal duodenal bulb and second portion of the duodenum. - Chronic gastritis. Biopsied. - A Nissen fundoplication was found. The wrap appears intact. - Normal gastroesophageal junction and esophagus.  - Non- bleeding external hemorrhoids, source of hematochezia. - The examination was otherwise normal. - No specimens collected. Colonoscopy 11/06/2012 Diagnosis:  Part A: RIGHT COLON RANDOM COLD BIOPSY:  - NEGATIVE FOR COLITIS.  Marland Kitchen  Part B: LEFT COLON RANDOM COLD BIOPSY:  - NEGATIVE FOR COLITIS.  EGD 08/18/2017 DIAGNOSIS:  A.  STOMACH, ANTRUM AND BODY; COLD BIOPSY:  - MODERATE CHRONIC INACTIVE GASTRITIS.  - NEGATIVE FOR INTESTINAL METAPLASIA, DYSPLASIA, AND MALIGNANCY.  - NEGATIVE FOR H. PYLORI BY IMMUNOHISTOCHEMISTRY.   Comment:  Treated H. pylori gastritis could have this histologic appearance, but  autoimmune gastritis is not excluded.  Clinical correlation is  recommended.   B.  DUODENUM, SMALL MUCOSAL PLAQUE OF THE THIRD PORTION; COLD BIOPSY:  - DILATED LACTEALS.  - NEGATIVE FOR INFECTIOUS AGENTS, DYSPLASIA, AND MALIGNANCY.   Comment:  As a focal isolated finding, dilated lacteals are of no clinical  significance. Correlation with history of obstruction, radiation, etc.  is recommended.   C.  GASTROESOPHAGEAL JUNCTION; COLD BIOPSY:  - SQUAMOCOLUMNAR MUCOSA WITH MILD CHRONIC INFLAMMATION.  - NEGATIVE FOR GOBLET CELLS, DYSPLASIA, AND MALIGNANCY.  Past Medical History:  Diagnosis Date   Achalasia    Diabetes mellitus without complication (HCC)    Gout    Hypercholesteremia    Hypertension    Wears hearing aid in right ear     Past Surgical History:  Procedure Laterality  Date   CHOLECYSTECTOMY     COLONOSCOPY WITH PROPOFOL N/A 11/22/2020   Procedure: COLONOSCOPY WITH PROPOFOL;  Surgeon: Toney Reil, MD;  Location: [facility];  Service: Endoscopy;   Laterality: N/A;   ESOPHAGOGASTRODUODENOSCOPY (EGD) WITH PROPOFOL N/A 08/18/2017   Procedure: ESOPHAGOGASTRODUODENOSCOPY (EGD) WITH PROPOFOL;  Surgeon: Christena Deem, MD;  Location: [facility];  Service: Endoscopy;  Laterality: N/A;   ESOPHAGOGASTRODUODENOSCOPY (EGD) WITH PROPOFOL N/A 11/22/2020   Procedure: ESOPHAGOGASTRODUODENOSCOPY (EGD) WITH PROPOFOL;  Surgeon: Toney Reil, MD;  Location: [facility];  Service: Endoscopy;  Laterality: N/A;  Diabetic   ESOPHAGOGASTRODUODENOSCOPY (EGD) WITH PROPOFOL N/A 12/10/2020   Procedure: ESOPHAGOGASTRODUODENOSCOPY (EGD) WITH PROPOFOL;  Surgeon: Toney Reil, MD;  Location: [facility] ENDOSCOPY;  Service: Gastroenterology;  Laterality: N/A;   HERNIA REPAIR      Current Outpatient Medications:    Cholecalciferol (VITAMIN D3 PO), Take by mouth., Disp: , Rfl:    insulin glargine (LANTUS) 100 UNIT/ML Solostar Pen, Inject into the skin daily., Disp: , Rfl:    linagliptin (TRADJENTA) 5 MG TABS tablet, Take 1 tablet (5 mg total) by mouth daily., Disp: 30 tablet, Rfl: 1   lovastatin (MEVACOR) 40 MG tablet, , Disp: , Rfl:    metFORMIN (GLUCOPHAGE) 500 MG tablet, Take 1 tablet (500 mg total) by mouth 2 (two) times daily with a meal., Disp: 60 tablet, Rfl: 1   TRUE METRIX BLOOD GLUCOSE TEST test strip, SMARTSIG:Via Meter, Disp: , Rfl:    TRUEplus Lancets 28G MISC, Apply topically., Disp: , Rfl:        No family history on file.   Social History   Tobacco Use   Smoking status: Former   Smokeless tobacco: Never  Vaping Use   Vaping status: Never Used  Substance Use Topics   Alcohol use: Not Currently    Comment: sometimes a beer   Drug use: Never    Allergies as of 01/28/2023 - Review Complete 01/28/2023  Allergen Reaction Noted   Acetaminophen Other (See Comments) 08/17/2017   Morphine and codeine Other (See Comments) 06/08/2016    Review of Systems:    All systems reviewed and negative except where noted in HPI.    Physical Exam:  BP 118/77 (BP Location: Left Arm, Patient Position: Sitting, Cuff Size: Normal)   Pulse 84   Temp 97.6 F (36.4 C) (Oral)   Ht 5\' 3"  (1.6 m)   Wt 157 lb (71.2 kg)   BMI 27.81 kg/m  No LMP for male patient.  General:   Alert,  Well-developed, well-nourished, pleasant and cooperative in NAD Head:  Normocephalic and atraumatic. Eyes:  Sclera clear, no icterus.   Conjunctiva pink. Ears:  Normal auditory acuity. Nose:  No deformity, discharge, or lesions. Mouth:  No deformity or lesions,oropharynx pink & moist. Neck:  Supple; no masses or thyromegaly. Lungs:  Respirations even and unlabored.  Clear throughout to auscultation.   No wheezes, crackles, or rhonchi. No acute distress. Heart:  Regular rate and rhythm; no murmurs, clicks, rubs, or gallops. Abdomen:  Normal bowel sounds. Soft, non-tender and non-distended without masses, hepatosplenomegaly or hernias noted.  No guarding or rebound tenderness.   Rectal: Not performed Msk:  Symmetrical without gross deformities. Good, equal movement & strength bilaterally. Pulses:  Normal pulses noted. Extremities:  No clubbing or edema.  No cyanosis. Neurologic:  Alert and oriented x3;  grossly normal neurologically. Psych:  Alert and cooperative. Normal mood and affect.  Imaging Studies: Reviewed  Assessment and Plan:  Andrew Schneiders Sr. is a 72 y.o.-year-old pleasant male with history of diabetes, history of cholecystectomy, Nissen's fundoplication for hernia repair is seen in consultation for reported history of painless hematochezia  Recommend upper endoscopy as well as colonoscopy for further evaluation  I have discussed alternative options, risks & benefits,  which include, but are not limited to, bleeding, infection, perforation,respiratory complication & drug reaction.  The patient agrees with this plan & written consent will be obtained.     Follow up as needed   Arlyss Repress, MD

## 2023-01-29 ENCOUNTER — Encounter: Payer: Self-pay | Admitting: Gastroenterology

## 2023-10-12 ENCOUNTER — Encounter: Payer: Self-pay | Admitting: *Deleted

## 2023-10-12 ENCOUNTER — Telehealth: Payer: Self-pay | Admitting: *Deleted

## 2023-10-12 NOTE — Telephone Encounter (Signed)
 LMOVM to verify card hx.

## 2023-10-16 NOTE — Progress Notes (Signed)
 Cardiology Office Note  Date:  10/19/2023   ID:  Andrew KANDICE Oz Sr., DOB 1950/11/27, MRN [id number]  PCP:  Buren Rock HERO, MD   Chief Complaint  Patient presents with   New Patient (Initial Visit)    Ref by Dr. Buren for pre-syncope & Type II diabetes. Patient c/o difficulties with shortness of breath & pre-syncope when taking a shower. Patient stated was seen in 2024 with a syncopal spell.     HPI:  Andrew Cantrellis a 73 y.o. male with past medical history of: Diabetes Hyperlipidemia Gastric metaplasia, GI bleed CRI creatinine 1.5 Who presents by referral from Dr. Rock Buren for near syncope, dizziness  In the hospital June 2024 GI bleed  Episode of near syncope April 2024 CR 1.6  Last year, was working outside in the ER, came in had a hot shower Got dizzy, developed near syncope  Reports twice a months will have near syncope symptoms in a hot shower  2 to 3 weeks ago reported having near syncope in the house  A1C 11.7 in 2024 No recent A1c available  EKG personally reviewed by myself on todays visit EKG Interpretation Date/Time:  Monday October 19 2023 14:00:10 EDT Ventricular Rate:  79 PR Interval:  178 QRS Duration:  80 QT Interval:  380 QTC Calculation: 435 R Axis:   10  Text Interpretation: Normal sinus rhythm When compared with ECG of 12-Aug-2022 18:35, No significant change was found Confirmed by Perla Lye [phone]) on 10/19/2023 2:20:45 PM     PMH:   has a past medical history of Achalasia, Diabetes mellitus without complication (HCC), Gout, Hypercholesteremia, Hypertension, and Wears hearing aid in right ear.  PSH:    Past Surgical History:  Procedure Laterality Date   CHOLECYSTECTOMY     COLONOSCOPY WITH PROPOFOL  N/A 11/22/2020   Procedure: COLONOSCOPY WITH PROPOFOL ;  Surgeon: Unk Corinn Skiff, MD;  Location: [facility];  Service: Endoscopy;  Laterality: N/A;   ESOPHAGOGASTRODUODENOSCOPY (EGD) WITH PROPOFOL  N/A  08/18/2017   Procedure: ESOPHAGOGASTRODUODENOSCOPY (EGD) WITH PROPOFOL ;  Surgeon: Gaylyn Gladis PENNER, MD;  Location: [facility] ENDOSCOPY;  Service: Endoscopy;  Laterality: N/A;   ESOPHAGOGASTRODUODENOSCOPY (EGD) WITH PROPOFOL  N/A 11/22/2020   Procedure: ESOPHAGOGASTRODUODENOSCOPY (EGD) WITH PROPOFOL ;  Surgeon: Unk Corinn Skiff, MD;  Location: [facility];  Service: Endoscopy;  Laterality: N/A;  Diabetic   ESOPHAGOGASTRODUODENOSCOPY (EGD) WITH PROPOFOL  N/A 12/10/2020   Procedure: ESOPHAGOGASTRODUODENOSCOPY (EGD) WITH PROPOFOL ;  Surgeon: Unk Corinn Skiff, MD;  Location: [facility] ENDOSCOPY;  Service: Gastroenterology;  Laterality: N/A;   HERNIA REPAIR      Current Outpatient Medications  Medication Sig Dispense Refill   Cholecalciferol  (VITAMIN D3 PO) Take by mouth.     Colchicine 0.6 MG CAPS Take 0.6 mg by mouth daily.     insulin  glargine (LANTUS) 100 UNIT/ML Solostar Pen Inject into the skin daily.     levothyroxine (SYNTHROID) 50 MCG tablet Take 50 mcg by mouth daily.     linagliptin  (TRADJENTA ) 5 MG TABS tablet Take 1 tablet (5 mg total) by mouth daily. 30 tablet 1   lovastatin (MEVACOR) 40 MG tablet      metFORMIN  (GLUCOPHAGE ) 500 MG tablet Take 1 tablet (500 mg total) by mouth 2 (two) times daily with a meal. 60 tablet 1   TRUE METRIX BLOOD GLUCOSE TEST test strip SMARTSIG:Via Meter     TRUEplus Lancets 28G MISC Apply topically.     No current facility-administered medications for this visit.     Allergies:   Acetaminophen   and Morphine and codeine   Social History:  The patient  reports that he has quit smoking. He has never used smokeless tobacco. He reports that he does not currently use alcohol. He reports that he does not use drugs.   Family History:   family history includes Diabetes in his father and mother.    Review of Systems: Review of Systems  Constitutional: Negative.   HENT: Negative.    Respiratory: Negative.    Cardiovascular: Negative.   Gastrointestinal:  Negative.   Musculoskeletal: Negative.   Neurological:  Positive for dizziness.  Psychiatric/Behavioral: Negative.    All other systems reviewed and are negative.    PHYSICAL EXAM: VS:  BP 106/64 (BP Location: Right Arm, Patient Position: Sitting, Cuff Size: Normal)   Pulse 79   Ht 5' 3.5 (1.613 m)   Wt 157 lb (71.2 kg)   SpO2 96%   BMI 27.38 kg/m  , BMI Body mass index is 27.38 kg/m. GEN: Well nourished, well developed, in no acute distress HEENT: normal Neck: no JVD, carotid bruits, or masses Cardiac: RRR; no murmurs, rubs, or gallops,no edema  Respiratory:  clear to auscultation bilaterally, normal work of breathing GI: soft, nontender, nondistended, + BS MS: no deformity or atrophy Skin: warm and dry, no rash Neuro:  Strength and sensation are intact Psych: euthymic mood, full affect    Recent Labs: No results found for requested labs within last 365 days.    Lipid Panel No results found for: CHOL, HDL, LDLCALC, TRIG    Wt Readings from Last 3 Encounters:  10/19/23 157 lb (71.2 kg)  01/28/23 157 lb (71.2 kg)  08/12/22 157 lb (71.2 kg)       ASSESSMENT AND PLAN:  Problem List Items Addressed This Visit     Diabetes mellitus (HCC) - Primary   Relevant Orders   EKG 12-Lead (Completed)   Other Visit Diagnoses       Pre-syncope       Relevant Orders   EKG 12-Lead (Completed)     SOB (shortness of breath)       Relevant Orders   EKG 12-Lead (Completed)      Near-syncope -Episode April 2024 with near syncope, creatinine at that time 1.6 Likely secondary to orthostasis Blood pressure low on today's visit 102 up to 106 systolic Heart rate from 80 up to 92 with standing, blood pressure 110 systolic - Recommend he hydrate more, liberalize his salt intake Recommend a chair in the shower  - Turn down the temperature of the hot water - Echocardiogram ordered to rule out structural heart disease - Symptoms may be exacerbated by high glucose  levels leading to polyuria and dehydration  Chronic renal sufficiency with insulin -dependent diabetes Baseline creatinine appears 1.5 Stressed importance of aggressive diabetes control  Diabetes on insulin  Prior A1c last year 11.7 No recent A1c available Stressed importance of working closely with primary care for aggressive diabetes control  Signed, Velinda Lunger, M.D., Ph.D. [facility] [place], [place] [phone]

## 2023-10-19 ENCOUNTER — Ambulatory Visit: Attending: Cardiovascular Disease | Admitting: Cardiovascular Disease

## 2023-10-19 ENCOUNTER — Encounter: Payer: Self-pay | Admitting: Cardiovascular Disease

## 2023-10-19 VITALS — BP 106/64 | HR 79 | Ht 63.5 in | Wt 157.0 lb

## 2023-10-19 DIAGNOSIS — Z794 Long term (current) use of insulin: Secondary | ICD-10-CM | POA: Diagnosis not present

## 2023-10-19 DIAGNOSIS — R55 Syncope and collapse: Secondary | ICD-10-CM | POA: Diagnosis not present

## 2023-10-19 DIAGNOSIS — E1369 Other specified diabetes mellitus with other specified complication: Secondary | ICD-10-CM | POA: Diagnosis not present

## 2023-10-19 DIAGNOSIS — R0602 Shortness of breath: Secondary | ICD-10-CM

## 2023-10-19 NOTE — Patient Instructions (Addendum)

## 2023-11-12 ENCOUNTER — Ambulatory Visit

## 2023-12-25 ENCOUNTER — Ambulatory Visit: Attending: Cardiovascular Disease

## 2023-12-25 DIAGNOSIS — R55 Syncope and collapse: Secondary | ICD-10-CM

## 2023-12-25 LAB — ECHOCARDIOGRAM COMPLETE
Area-P 1/2: 4.31 cm2
S' Lateral: 2.4 cm

## 2023-12-26 ENCOUNTER — Ambulatory Visit: Payer: Self-pay | Admitting: Cardiovascular Disease

## 2023-12-28 ENCOUNTER — Encounter: Payer: Self-pay | Admitting: Emergency Medicine

## 2024-01-01 IMAGING — CR DG CHEST 2V
1 series · 2 of 2 positions shown · non-contrast
Comparison: 07/01/2017

CLINICAL DATA: Cough for 4 weeks.  Congestion.

EXAM:
CHEST - 2 VIEW

[Series 1: w chest pa · 0.14mm/px · 2 of 2 slices shown]
[im 1/2]
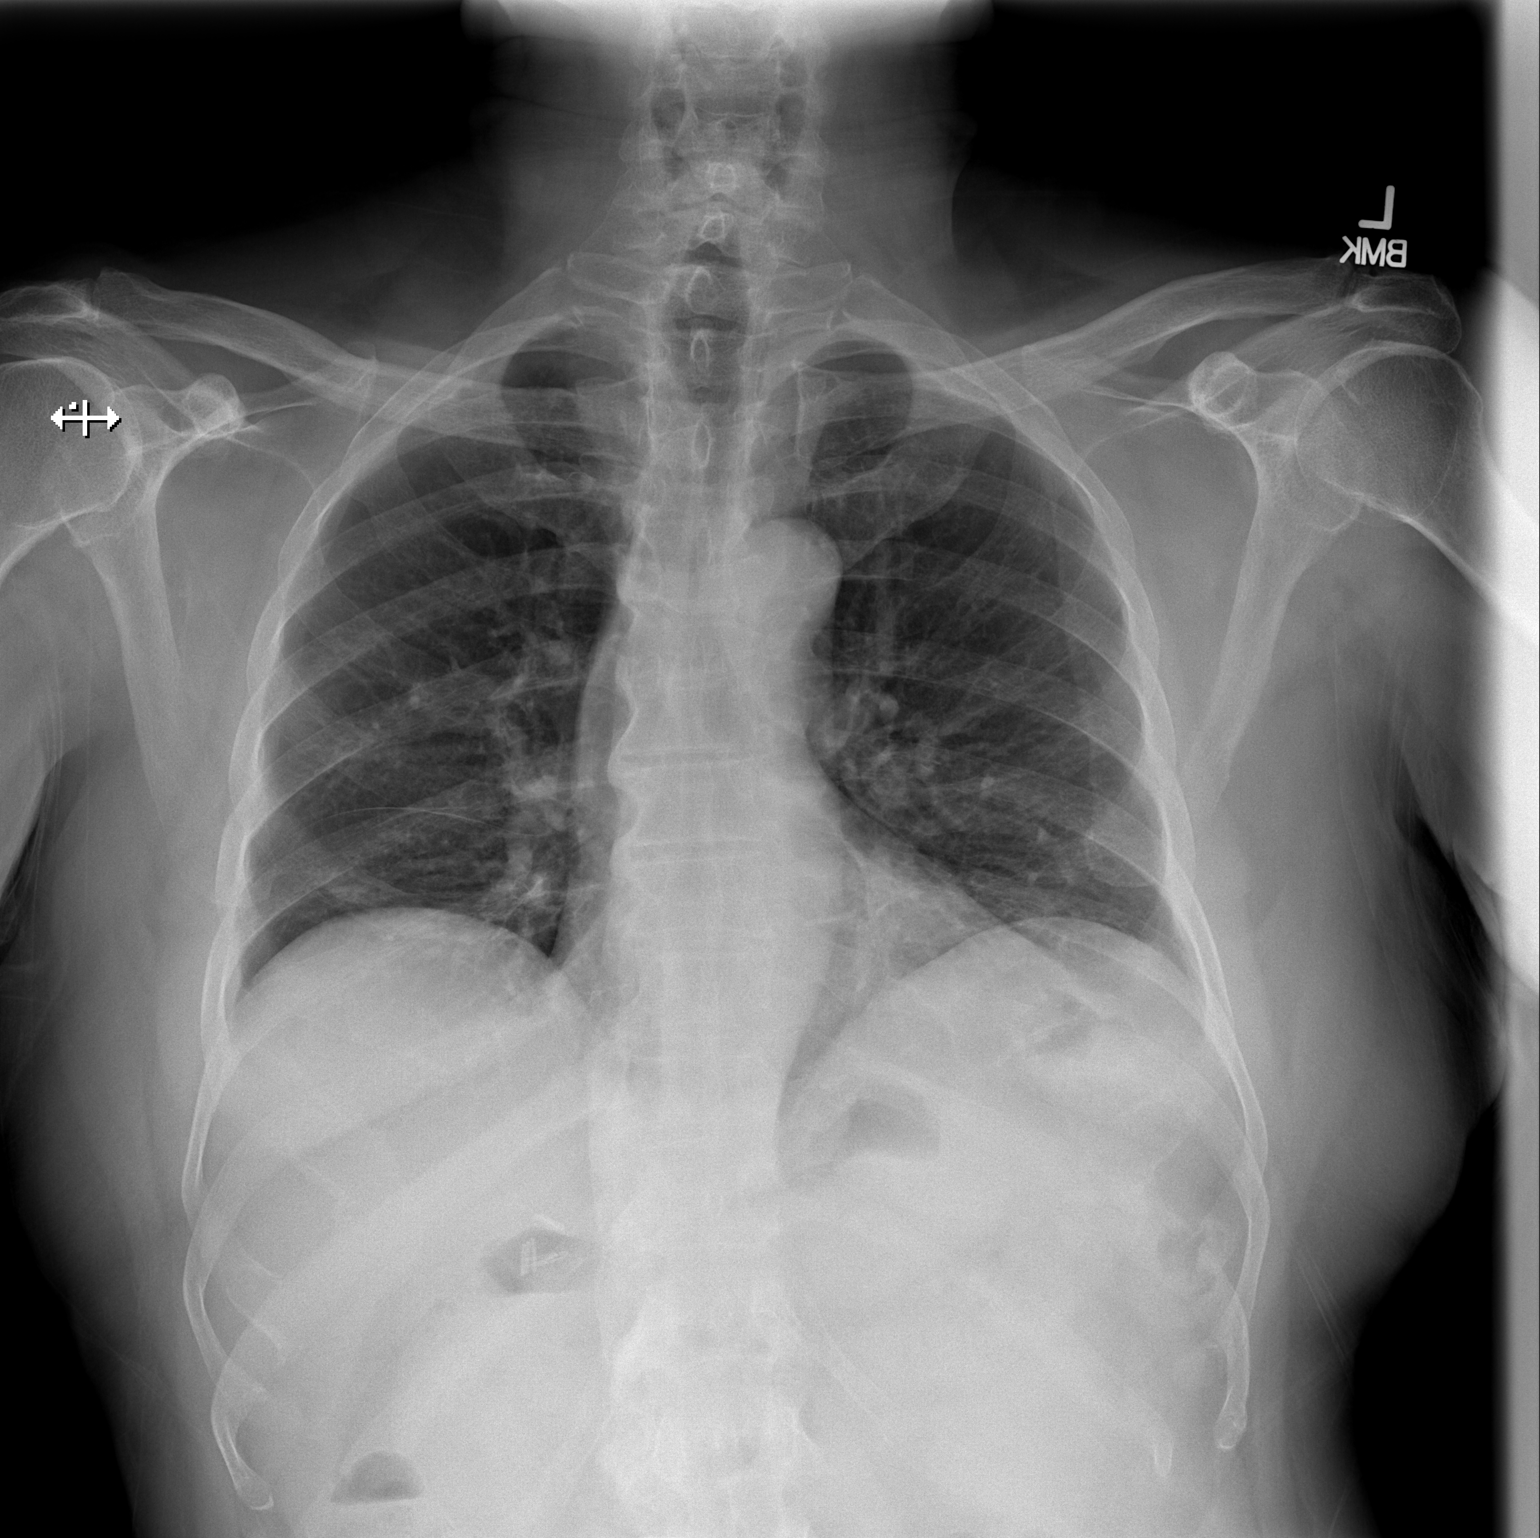
[im 2/2]
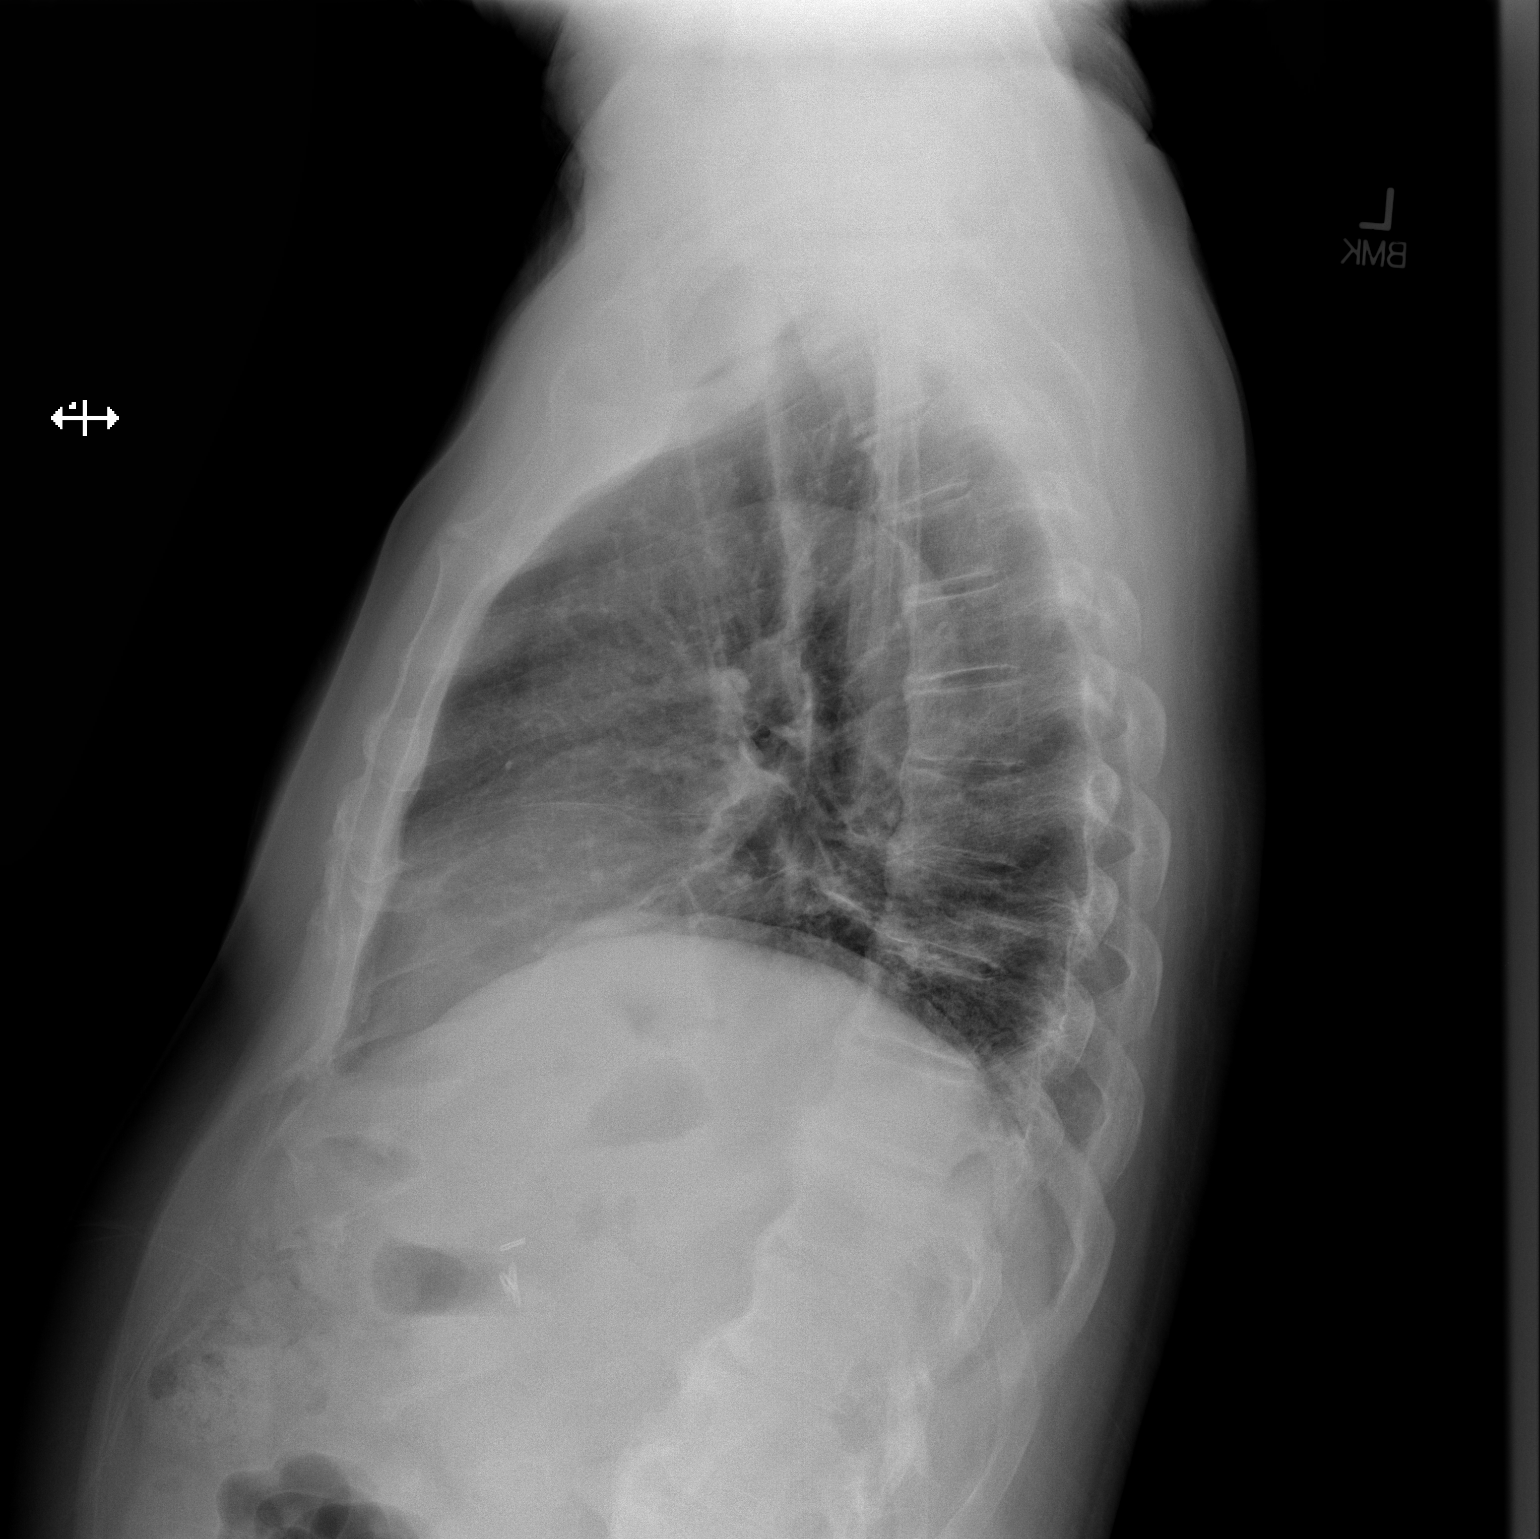

[2 of 2 positions shown; findings below may reference images not displayed]

FINDINGS: The cardiomediastinal contours are normal. Bibasilar scarring is
similar to prior exam. Pulmonary vasculature is normal. No acute
consolidation, pleural effusion, or pneumothorax. No acute osseous
abnormalities are seen.
IMPRESSION: Bibasilar scarring, unchanged from prior exam. No acute abnormality.
